# Patient Record
Sex: Female | Born: 1935 | Race: White | Hispanic: No | Marital: Married | Smoking: Never smoker
Health system: Southern US, Community
[De-identification: ages and names within clinical notes are randomized; demographics above are authoritative.]

## PROBLEM LIST (undated history)

## (undated) DIAGNOSIS — R58 Hemorrhage, not elsewhere classified: Principal | ICD-10-CM

## (undated) DIAGNOSIS — I10 Essential (primary) hypertension: Secondary | ICD-10-CM

## (undated) DIAGNOSIS — I498 Other specified cardiac arrhythmias: Secondary | ICD-10-CM

## (undated) DIAGNOSIS — I2510 Atherosclerotic heart disease of native coronary artery without angina pectoris: Secondary | ICD-10-CM

## (undated) DIAGNOSIS — E785 Hyperlipidemia, unspecified: Secondary | ICD-10-CM

## (undated) HISTORY — PX: EYE SURGERY: SHX253

## (undated) HISTORY — PX: INSERT / REPLACE / REMOVE PACEMAKER: SUR710

## (undated) HISTORY — DX: Other specified cardiac arrhythmias: I49.8

## (undated) HISTORY — DX: Hyperlipidemia, unspecified: E78.5

## (undated) HISTORY — DX: Atherosclerotic heart disease of native coronary artery without angina pectoris: I25.10

## (undated) HISTORY — PX: OTHER SURGICAL HISTORY: SHX169

## (undated) HISTORY — PX: APPENDECTOMY: SHX54

## (undated) HISTORY — PX: TONSILLECTOMY AND ADENOIDECTOMY: SHX28

## (undated) HISTORY — DX: Essential (primary) hypertension: I10

## (undated) HISTORY — PX: CARDIAC CATHETERIZATION: SHX172

## (undated) HISTORY — DX: Hemorrhage, not elsewhere classified: R58

---

## 1993-03-12 HISTORY — PX: BACK SURGERY: SHX140

## 2000-06-28 ENCOUNTER — Ambulatory Visit (HOSPITAL_COMMUNITY): Admission: RE | Admit: 2000-06-28 | Discharge: 2000-06-28 | Payer: Self-pay | Admitting: Obstetrics and Gynecology

## 2000-06-28 ENCOUNTER — Encounter: Payer: Self-pay | Admitting: Obstetrics and Gynecology

## 2000-10-25 ENCOUNTER — Ambulatory Visit (HOSPITAL_COMMUNITY): Admission: RE | Admit: 2000-10-25 | Discharge: 2000-10-25 | Payer: Self-pay | Admitting: Family Medicine

## 2000-10-25 ENCOUNTER — Encounter: Payer: Self-pay | Admitting: Family Medicine

## 2001-06-09 ENCOUNTER — Ambulatory Visit (HOSPITAL_COMMUNITY): Admission: RE | Admit: 2001-06-09 | Discharge: 2001-06-09 | Payer: Self-pay | Admitting: Neurosurgery

## 2001-06-09 ENCOUNTER — Encounter: Payer: Self-pay | Admitting: Neurosurgery

## 2002-01-22 ENCOUNTER — Ambulatory Visit (HOSPITAL_COMMUNITY): Admission: RE | Admit: 2002-01-22 | Discharge: 2002-01-22 | Payer: Self-pay | Admitting: Family Medicine

## 2002-01-22 ENCOUNTER — Encounter: Payer: Self-pay | Admitting: Family Medicine

## 2002-10-14 ENCOUNTER — Ambulatory Visit: Admission: RE | Admit: 2002-10-14 | Discharge: 2002-10-14 | Payer: Self-pay | Admitting: Orthopedic Surgery

## 2002-10-14 ENCOUNTER — Encounter: Payer: Self-pay | Admitting: Orthopedic Surgery

## 2003-08-12 ENCOUNTER — Emergency Department (HOSPITAL_COMMUNITY): Admission: EM | Admit: 2003-08-12 | Discharge: 2003-08-12 | Payer: Self-pay | Admitting: Emergency Medicine

## 2003-09-02 ENCOUNTER — Ambulatory Visit (HOSPITAL_COMMUNITY): Admission: RE | Admit: 2003-09-02 | Discharge: 2003-09-02 | Payer: Self-pay | Admitting: General Surgery

## 2004-01-19 ENCOUNTER — Ambulatory Visit: Payer: Self-pay | Admitting: Cardiology

## 2004-01-20 ENCOUNTER — Ambulatory Visit (HOSPITAL_COMMUNITY): Admission: RE | Admit: 2004-01-20 | Discharge: 2004-01-20 | Payer: Self-pay | Admitting: Family Medicine

## 2004-01-31 ENCOUNTER — Ambulatory Visit (HOSPITAL_COMMUNITY): Admission: RE | Admit: 2004-01-31 | Discharge: 2004-02-01 | Payer: Self-pay | Admitting: Family Medicine

## 2004-02-02 ENCOUNTER — Ambulatory Visit: Payer: Self-pay | Admitting: *Deleted

## 2004-02-07 ENCOUNTER — Ambulatory Visit: Payer: Self-pay | Admitting: *Deleted

## 2004-02-09 ENCOUNTER — Ambulatory Visit: Payer: Self-pay | Admitting: *Deleted

## 2004-02-09 ENCOUNTER — Ambulatory Visit (HOSPITAL_COMMUNITY): Admission: RE | Admit: 2004-02-09 | Discharge: 2004-02-09 | Payer: Self-pay | Admitting: *Deleted

## 2004-02-10 ENCOUNTER — Inpatient Hospital Stay (HOSPITAL_BASED_OUTPATIENT_CLINIC_OR_DEPARTMENT_OTHER): Admission: RE | Admit: 2004-02-10 | Discharge: 2004-02-10 | Payer: Self-pay | Admitting: *Deleted

## 2004-03-15 ENCOUNTER — Ambulatory Visit: Payer: Self-pay | Admitting: *Deleted

## 2004-05-08 ENCOUNTER — Ambulatory Visit (HOSPITAL_COMMUNITY): Admission: RE | Admit: 2004-05-08 | Discharge: 2004-05-08 | Payer: Self-pay | Admitting: Family Medicine

## 2004-07-13 ENCOUNTER — Emergency Department (HOSPITAL_COMMUNITY): Admission: EM | Admit: 2004-07-13 | Discharge: 2004-07-13 | Payer: Self-pay | Admitting: Emergency Medicine

## 2004-08-03 ENCOUNTER — Ambulatory Visit (HOSPITAL_COMMUNITY): Admission: RE | Admit: 2004-08-03 | Discharge: 2004-08-03 | Payer: Self-pay | Admitting: Family Medicine

## 2004-10-20 ENCOUNTER — Ambulatory Visit: Payer: Self-pay | Admitting: *Deleted

## 2004-11-03 ENCOUNTER — Ambulatory Visit: Payer: Self-pay | Admitting: *Deleted

## 2004-11-29 ENCOUNTER — Inpatient Hospital Stay (HOSPITAL_COMMUNITY): Admission: RE | Admit: 2004-11-29 | Discharge: 2004-12-01 | Payer: Self-pay | Admitting: Orthopedic Surgery

## 2006-05-07 ENCOUNTER — Ambulatory Visit: Payer: Self-pay | Admitting: Cardiovascular Disease

## 2006-05-22 ENCOUNTER — Ambulatory Visit: Payer: Self-pay | Admitting: Cardiovascular Disease

## 2006-05-22 ENCOUNTER — Encounter (HOSPITAL_COMMUNITY): Admission: RE | Admit: 2006-05-22 | Discharge: 2006-06-21 | Payer: Self-pay | Admitting: Cardiovascular Disease

## 2007-06-12 ENCOUNTER — Ambulatory Visit: Payer: Self-pay | Admitting: Cardiovascular Disease

## 2007-10-29 ENCOUNTER — Ambulatory Visit: Payer: Self-pay | Admitting: Internal Medicine

## 2007-10-29 ENCOUNTER — Inpatient Hospital Stay (HOSPITAL_COMMUNITY): Admission: EM | Admit: 2007-10-29 | Discharge: 2007-10-31 | Payer: Self-pay | Admitting: Emergency Medicine

## 2007-10-30 ENCOUNTER — Encounter: Payer: Self-pay | Admitting: Internal Medicine

## 2007-10-30 HISTORY — PX: PACEMAKER INSERTION: SHX728

## 2007-11-10 ENCOUNTER — Ambulatory Visit: Payer: Self-pay

## 2007-11-20 ENCOUNTER — Ambulatory Visit: Payer: Self-pay | Admitting: Cardiology

## 2008-02-18 ENCOUNTER — Ambulatory Visit: Payer: Self-pay | Admitting: Internal Medicine

## 2008-03-31 ENCOUNTER — Encounter (INDEPENDENT_AMBULATORY_CARE_PROVIDER_SITE_OTHER): Payer: Self-pay | Admitting: *Deleted

## 2008-04-09 ENCOUNTER — Encounter (INDEPENDENT_AMBULATORY_CARE_PROVIDER_SITE_OTHER): Payer: Self-pay | Admitting: *Deleted

## 2008-11-03 DIAGNOSIS — I10 Essential (primary) hypertension: Secondary | ICD-10-CM

## 2008-11-03 DIAGNOSIS — I2510 Atherosclerotic heart disease of native coronary artery without angina pectoris: Secondary | ICD-10-CM | POA: Insufficient documentation

## 2008-11-03 DIAGNOSIS — E119 Type 2 diabetes mellitus without complications: Secondary | ICD-10-CM | POA: Insufficient documentation

## 2008-12-01 ENCOUNTER — Ambulatory Visit: Payer: Self-pay | Admitting: Internal Medicine

## 2008-12-01 DIAGNOSIS — Z950 Presence of cardiac pacemaker: Secondary | ICD-10-CM | POA: Insufficient documentation

## 2009-06-15 ENCOUNTER — Ambulatory Visit: Payer: Self-pay | Admitting: Cardiology

## 2010-04-07 ENCOUNTER — Ambulatory Visit
Admission: RE | Admit: 2010-04-07 | Discharge: 2010-04-07 | Payer: Self-pay | Source: Home / Self Care | Attending: Internal Medicine | Admitting: Internal Medicine

## 2010-04-07 ENCOUNTER — Encounter: Payer: Self-pay | Admitting: Internal Medicine

## 2010-04-13 NOTE — Procedures (Signed)
Summary: PC2 6 MON   Current Medications (verified): 1)  Simvastatin 40 Mg Tabs (Simvastatin) .... Take 1 Tab Daily 2)  Diovan 160 Mg Tabs (Valsartan) .... Take 1 Tab Daily 3)  Metformin Hcl 1000 Mg Tabs (Metformin Hcl) .... Take 1 Tab Two Times A Day 4)  Glucotrol 5 Mg Tabs (Glipizide) .... One By Mouth in The Am  2 By Mouth Pm 5)  Metoprolol Succinate 25 Mg Xr24h-Tab (Metoprolol Succinate) .... Take 1 Tab Daily 6)  Oxybutynin Chloride 5 Mg Tabs (Oxybutynin Chloride) .... Take 1 Tab Daily 7)  Aspir-Low 81 Mg Tbec (Aspirin) .... Take 1tab Daily 8)  Fish Oil Concentrate 1200 Mg Caps (Omega-3 Fatty Acids) .... Take 1 Tab Daily 9)  Mag-Ox 400 400 Mg Tabs (Magnesium Oxide) .... One By Mouth Daily  Allergies (verified): No Known Drug Allergies   PPM Specifications Following MD:  Lewayne Bunting, MD     PPM Vendor:  St Jude     PPM Model Number:  [id number] [id number]     PPM Serial Number:  [id number] PPM DOI:  10/30/2007      Lead 1    Location: RA     DOI: 10/30/2007     Model #: [id number]     Serial #: [id number]     Status: active Lead 2    Location: RV     DOI: 10/30/2007     Model #: 1788TC     Serial #: [id number]     Status: active  Magnet Response Rate:  BOL 98.6 ERI 86.3  Indications:  Intermittent CHB   PPM Follow Up Remote Check?  No Battery Voltage:  2.78 V     Battery Est. Longevity:  9.75 years     Pacer Dependent:  No       PPM Device Measurements Atrium  Amplitude: 1.2 mV, Impedance: 350 ohms, Threshold: 0.5 V at 0.5 msec Right Ventricle  Amplitude: 12 mV, Impedance: 407 ohms, Threshold: 0.5 V at 0.5 msec  Episodes MS Episodes:  0     Percent Mode Switch:  0     Coumadin:  No Atrial Pacing:  28%     Ventricular Pacing:  <1%  Parameters Mode:  DDD     Lower Rate Limit:  60     Upper Rate Limit:  115 Paced AV Delay:  275     Sensed AV Delay:  250 Next Cardiology Appt Due:  12/10/2009 Tech Comments:  Ventricular auto capture on.   Device function normal.  ROV 6 months with Dr. Ladona Ridgel in  [facility]. Altha Harm, LPN  June 16, 863 11:07 AM  MD Comments:  Agree with above.

## 2010-04-13 NOTE — Assessment & Plan Note (Signed)
Summary: 6 mth f/u per checkout on 06/15/09/tg   Visit Type:  Follow-up Primary Provider:  Dr.Gerald Hill   History of Present Illness: [name] returns for PPM followup.  She is a pleasant elderly woman with a h/o HTN and symptomatic bradycardia who underwent PPM in 10/2007.  She denies c/p or sob.  Minimal peripheral edema. She remains quite active going dancing 4-5 times a week.  Current Medications (verified): 1)  Simvastatin 40 Mg Tabs (Simvastatin) .... Take 1 Tab Daily 2)  Diovan Hct 160-12.5 Mg Tabs (Valsartan-Hydrochlorothiazide) .... Take 1 Tab Daily 3)  Metformin Hcl 1000 Mg Tabs (Metformin Hcl) .... Take 1 Tab Two Times A Day 4)  Glucotrol 5 Mg Tabs (Glipizide) .... One By Mouth in The Am  2 By Mouth Pm 5)  Metoprolol Succinate 25 Mg Xr24h-Tab (Metoprolol Succinate) .... Take 1 Tab Daily 6)  Oxybutynin Chloride 5 Mg Tabs (Oxybutynin Chloride) .... Take 1 Tab Two Times A Day 7)  Aspir-Low 81 Mg Tbec (Aspirin) .... Take 1tab Daily 8)  Fish Oil Concentrate 1200 Mg Caps (Omega-3 Fatty Acids) .... Take 1 Tab Daily 9)  Potassium 99 Mg Tabs (Potassium) .... Take 1 Tab Daily 10)  Melatonin 5 Mg Tabs (Melatonin) .... Take 1 Tab Dialy 11)  Aspir-Low 81 Mg Tbec (Aspirin) .... Take 1 Tab Daily  Allergies (verified): No Known Drug Allergies  Comments:  Nurse/Medical Assistant: patient brought med list [place] [facility] and [facility] the only change is diovan 160 went to diovan 160/12.5 mg daily  Past History:  Past Medical History: Last updated: 11/03/2008 Current Problems:  DM (ICD-250.00) HYPERTENSION (ICD-401.9) CAD (ICD-414.00)  Past Surgical History: Last updated: 11/03/2008 cath pacemaker inserted st.jude zephyr dual lead pacemaker august 20,2009  Review of Systems  The patient denies chest pain, syncope, dyspnea on exertion, and peripheral edema.    Vital Signs:  Patient profile:   77 year old female Weight:      122 pounds BMI:     20.38 Pulse rate:    69 / minute BP sitting:   133 / 62  (left arm)  Vitals Entered By: Dreama Saa, CNA (April 07, 2010 8:56 AM)  Physical Exam  General:  Well developed, well nourished, in no acute distress. Head:  normocephalic and atraumatic Eyes:  PERRLA/EOM intact; conjunctiva and lids normal. Mouth:  Teeth, gums and palate normal. Oral mucosa normal. Neck:  Neck supple, no JVD. No masses, thyromegaly or abnormal cervical nodes. Chest Wall:  Well healed PPM incision. Lungs:  Clear bilaterally with no wheezes, rales, or rhonchi. Heart:  RRR with normal S1 and S2.  PMI is not enalarged or laterally displaced. Abdomen:  Bowel sounds positive; abdomen soft and non-tender without masses, organomegaly, or hernias noted. No hepatosplenomegaly. Msk:  Back normal, normal gait. Muscle strength and tone normal. Pulses:  pulses normal in all 4 extremities Extremities:  No clubbing or cyanosis. No edema. Neurologic:  Alert and oriented x 3.   PPM Specifications Following MD:  Lewayne Bunting, MD     PPM Vendor:  St Jude     PPM Model Number:  [id number]     PPM Serial Number:  [id number] PPM DOI:  10/30/2007      Lead 1    Location: RA     DOI: 10/30/2007     Model #: 1699TC     Serial #: [id number]     Status: active Lead 2    Location: RV     DOI: 10/30/2007  Model #: [id number]     Serial #: [id number]     Status: active  Magnet Response Rate:  BOL 98.6 ERI 86.3  Indications:  Intermittent CHB   PPM Follow Up Remote Check?  No Battery Voltage:  2.78 V     Battery Est. Longevity:  9.25 years     Pacer Dependent:  No       PPM Device Measurements Atrium  Amplitude: 0.8 mV, Impedance: 313 ohms, Threshold: 0.5 V at 0.5 msec Right Ventricle  Amplitude: 12 mV, Impedance: 352 ohms, Threshold: 0.5 V at 0.5 msec  Episodes MS Episodes:  1     Percent Mode Switch:  <1%     Coumadin:  No Atrial Pacing:  31%     Ventricular Pacing:  1%  Parameters Mode:  DDD     Lower Rate Limit:  60     Upper Rate Limit:  115 Paced  AV Delay:  275     Sensed AV Delay:  250 Tech Comments:  No parameter changes.  1 mode switch lasting 2:26 minutes, - coumadin.  ROV 6 months [facility] Altha Harm, LPN  April 07, 2010 9:12 AM  MD Comments:  Agree with above. Her mode switch rate was 180/min.  Impression & Recommendations:  Problem # 1:  CARDIAC PACEMAKER IN SITU (ICD-V45.01) Her device is working normally. Will followup in several monthns.  Problem # 2:  HYPERTENSION (ICD-401.9) Her blood pressure appears to be well controlled. will ask her to maintain a low sodium diet. Her updated medication list for this problem includes:    Diovan Hct 160-12.5 Mg Tabs (Valsartan-hydrochlorothiazide) .Marland Kitchen... Take 1 tab daily    Metoprolol Succinate 25 Mg Xr24h-tab (Metoprolol succinate) .Marland Kitchen... Take 1 tab daily    Aspir-low 81 Mg Tbec (Aspirin) .Marland Kitchen... Take 1tab daily    Aspir-low 81 Mg Tbec (Aspirin) .Marland Kitchen... Take 1 tab daily  Patient Instructions: 1)  Your physician recommends that you schedule a follow-up appointment in: 6months for device check and in 1 year with Dr. Ladona Ridgel 2)  Your physician recommends that you continue on your current medications as directed. Please refer to the Current Medication list given to you today.

## 2010-07-25 NOTE — Op Note (Signed)
[name], Bridget NO.:  [id number]   MEDICAL RECORD NO.:  [id number]          PATIENT TYPE:  INP   LOCATION:  2903                         FACILITY:  [facility]   PHYSICIAN:  Doylene Canning. Ladona Ridgel, MD    DATE OF BIRTH:  02-09-1926   DATE OF PROCEDURE:  10/30/2007  DATE OF DISCHARGE:                               OPERATIVE REPORT   PROCEDURE PERFORMED:  Implantation of a dual-chamber pacemaker.   INDICATIONS:  Symptomatic intermittent complete heart block.   OPERATIVE INDICATIONS:  The patient is an 77 year old lady with a  history of hypertension and left bundle-branch block who is admitted to  the hospital after experiencing dizziness and lightheadedness and  shortness of breath.  She was subsequently found to be in 2:1 heart  block as well periods of complete heart block and she is now referred  for insertion of a dual-chamber pacemaker.   PROCEDURE:  After informed consent was obtained, the patient was taken  to the diagnostic EP lab in fasting state.  After usual preparation and  draping, intravenous fentanyl and midazolam was given for sedation.  Lidocaine 30 mL was infiltrated into the left infraclavicular region.  A  5-cm incision was carried out over this region and electrocautery was  utilized to dissect down to the fascial plane.  The left subclavian vein  was punctured x2 and the St. Jude model 1788T, 52-cm active-fixation  pacing lead, serial #[id number] was advanced to the right ventricle.  The  St. Jude model [phone], 46-cm active-fixation pacing lead, serial  [id number] was advanced through right atrium.  Mapping was subsequently  carried out in the right ventricle and at the final site.  The R-waves  measured 16 mV, pacing threshold 0.6 volts at 0.5 milliseconds, and  pacing impedance was 656 ohms with lead actively fixed.  A 10-volt  pacing not stimulate the diaphragm.  With the ventricular lead in  satisfactory position, attention was then turned to  placement of the  atrial lead, which was placed in the anterolateral portion of the right  atrium where P-waves were 3, the pacing threshold with lead actively  fixed 0.5 volts at 0.5 milliseconds, and 10-volt pacing was 598 ohms.  With both the atrial and ventricular leads in satisfactory position, it  was secured to subpectoralis fascia with a figure-of-eight silk suture.  The sewing sleeve was also secured with silk suture.  Electrocautery was  to make a subcutaneous pocket. Kanamycin irrigation was utilized to  irrigate the pocket and electrocautery was utilized to assure  hemostasis.  A St. Jude Zephyr XL DR dual-chamber pacemaker, serial  [id number] was advanced to the atrium, otherwise was connected to the  atrial and ventricular leads and placed back in the subcutaneous pocket  where the generator secured with silk suture.  Additional Kanamycin was  utilized to irrigate the pocket and the incision was closed with 2-0  Vicryl followed by layer of 3-0 Vicryl.  Benzoin was painted on the  skin.  Steri-Strips were applied.  A pressure dressing was placed, and  the patient was returned to her room  in satisfactory condition.   COMPLICATIONS:  There were no immediate complications.   RESULTS:  This demonstrates successful implantation of a St. Jude dual-  chamber pacemaker to the patient with symptomatic intermittent complete  heart block.  There were no immediate complications.      Doylene Canning. Ladona Ridgel, MD  Electronically Signed     GWT/MEDQ  D:  10/30/2007  T:  10/31/2007  Job:  [id number]   cc:   Annia Friendly. Loleta Chance, MD

## 2010-07-25 NOTE — Assessment & Plan Note (Signed)
[facility]                       [place] CARDIOLOGY OFFICE NOTE   Bridget, Rowland                        MRN:          [id number]  DATE:11/20/2007                            DOB:          Aug 07, 1925    CARDIOLOGIST:  Previously Dr. Charlton Haws.  I will establish her with  Dr. Diona Browner in our [place] office for future followup.  Electrophysiologist, Dr. Lewayne Bunting.   PRIMARY CARE PHYSICIAN:  Annia Friendly. Hill, MD   REASON FOR VISIT:  Post-hospitalization followup.   HISTORY OF PRESENT ILLNESS:  [name] is an 77 year old female patient  with a history of normal coronaries by catheterization in 2005,  preserved LV function, and a previous history of PVCs and nonsustained  ventricular tachycardia as demonstrated on prior event monitoring.  Her  workup in the past included a Myoview study in March 2008 that revealed  no ischemia or infarction and EF of 80%.  No further workup was felt to  be warranted for her nonsustained ventricular tachycardia in the setting  of normal LV function and no ischemic heart disease.  She recently  presented to [facility] with near-syncope.  She was noted to  have symptomatic intermittent complete heart block.  She underwent  permanent pacemaker implantation with Dr. Lewayne Bunting on August 20.  An  echocardiogram during that admission revealed an EF of 55%, mild mitral  regurgitation, and mild to moderately increased estimated peak pulmonary  systolic pressures.  Her hospitalization was fairly uneventful and she  returns for followup today.  She notes some warmth over her pacemaker  site.  She has already seen the nurse in the [facility] in  followup since discharge.  Apparently, her pacemaker is functioning well  and she is only pacing about 1% of the time.  She denies any fevers,  drainage, or redness.  She denies chest pain.  She had 1 episode of  shortness of breath when she arose too quickly  the other day.  Other  than that, she denies exertional shortness of breath.  She denies any  syncope.   CURRENT MEDICATIONS:  Metoprolol 25 mg daily, Diovan 160 mg daily,  Metformin 1 g b.i.d., Glipizide 5 mg b.i.d., Oxybutynin 5 mg daily,  Aspirin 81 mg daily, Simvastatin 40 mg daily,  Amitriptyline 25 mg daily.   PHYSICAL EXAMINATION:  GENERAL:  She is a well-nourished and well-  developed female in no distress.  VITAL SIGNS:  Blood pressure is 110/60, pulse 71, and weight 133 pounds.  HEENT:  Normal.  NECK:  Without JVD.  CARDIAC:  S1 and S2.  Regular rate and rhythm.  LUNGS:  Clear to auscultation bilaterally.  ABDOMEN:  Normoactive bowel sounds.  EXTREMITIES:  Without edema.  NEUROLOGIC:  She is alert and oriented x3.  Cranial nerves II through  XII are grossly intact.  VASCULAR:  Without carotid bruits bilaterally.   DATABASE:  Labs from her recent hospitalization revealed a total  cholesterol of 75, triglycerides 161, HDL 28, and LDL 15.  Her AST was  32 and ALT 37.   Electrocardiogram reveals sinus rhythm  at heart rate of 71 and left  bundle-branch block (chronic).   ASSESSMENT AND PLAN:  1. Symptomatic intermittent complete heart block, status post      permanent pacemaker implantation.  She is overall stable.  Her      wound looks stable.  There is some warmth, but there is no redness.      Her swelling is minimal.  There is no discharge.  She is afebrile      today with a temperature of 98.4 degrees Fahrenheit.  I have      apprised her of the signs and symptoms to look for that would      indicate infection.  She knows to call us back if there are any      problems.  She will continue follow up with Dr. Ladona Ridgel as      indicated.  2. Hyperlipidemia.  As noted above, her recent lab work revealed a      significantly depressed LDL level.  I have recommended that we      decrease her simvastatin to 20 mg daily.  She will have followup      blood work with her  primary care physician in a couple of months      and we will obtain those results.  3. Hypertension.  This is well controlled.  She will continue on her      current medications.  4. Diabetes mellitus.  She will continue followup with her primary      care physician for this.  .   DISPOSITION:  The patient will be brought back in routine followup with  Dr. Diona Browner in 1 year's time.  She knows to return sooner as needed.  She will obtain her pacemaker followup with Dr. Ladona Ridgel as indicated.      Tereso Newcomer, PA-C  Electronically Signed      Jonelle Sidle, MD  Electronically Signed   SW/MedQ  DD: 11/20/2007  DT: 11/21/2007  Job #: [id number]   cc:   Annia Friendly. Loleta Chance, MD

## 2010-07-25 NOTE — Assessment & Plan Note (Signed)
[facility]                       [facility] CARDIOLOGY OFFICE NOTE   TAMMEE, THIELKE                        MRN:          [id number]  DATE:06/12/2007                            DOB:          1925/08/25    Ms. Sesma is a delightful 77 year old patient who had previous  presyncope, PVCs and hypertension.  She has had nonobstructive coronary  disease by cath in 2005.  She had good LV function in 2005, by echo.   The patient was recently evaluated for a presyncopal episode   In March 2008, she had normal LV function by echo.  Holter monitoring  showed only occasional PVCs was no arrhythmias and a nonischemic Myoview  with an EF of 80%.   The patient has been feeling well.  She has not had any recurrent  episodes.  She denied any significant chest pain, palpitation, PND or  orthopnea.  She saw Dr. Loleta Chance recently for a physical and is doing well.  She has been compliant with her meds.   REVIEW OF SYSTEMS:  Remarkable for good blood sugar control.  Her  hemoglobin A1c was under 7, and she checks her sugar twice a day.  She  is otherwise active.   CURRENT MEDICATIONS:  1. Metoprolol 25 a day.  2. Diovan 160 a day.  3. Metformin 1 gm b.i.d.  4. Glipizide five b.i.d.  5. Aspirin daily.  6. Simvastatin 40 a day.  7. Amitriptyline 25 a day.   PHYSICAL EXAMINATION:  GENERAL:  Remarkable for a healthy-appearing  white female who looks younger than her stated age.  VITAL SIGNS:  Weight is 134, blood pressure 140/70, pulse 74,  respiratory rate 14, afebrile.  HEENT:  Unremarkable.  NECK:  Carotids normal without bruit.  No lymphadenopathy, thyromegaly  or JVP elevation.  LUNGS:  Clear.  Good diaphragmatic motion.  No wheezing.  CARDIOVASCULAR:  S1-S2, normal heart sounds.  PMI normal.  ABDOMEN:  Benign.  Bowel sounds positive.  No AAA, no tenderness.  No  hepatosplenomegalyhepatojugular reflux.  Distal pulses intact.  No edema.  NEURO:   Nonfocal.  SKIN:  Warm and dry.  No muscular weakness.   IMPRESSION:  1. Presyncope, not recurrent, benign workup, continue to monitor.  2. History of premature ventricular contractions and short bursts of      nonsustained ventricular tachycardia.  Continue beta-blocker.  Not      significant in the setting of normal Myoview and normal ol      ventricular function.  3. Hypertension, currently well-controlled.  Continue low-salt diet,      Diovan and beta-blocker.  Overall, I think Nikira is doing well.  She      has a good chance to reach 89 given her current health, longevity      runs in her family.  Will see her back in a year.     Noralyn Pick. Eden Emms, MD, [facility]  Electronically Signed    PCN/MedQ  DD: 06/12/2007  DT: 06/12/2007  Job #: [id number]   cc:   Annia Friendly. Loleta Chance, MD

## 2010-07-25 NOTE — Discharge Summary (Signed)
Bridget Rowland, Bridget Rowland NO.:  [id number]   MEDICAL RECORD NO.:  [id number]          PATIENT TYPE:  INP   LOCATION:  2903                         FACILITY:  [facility]   PHYSICIAN:  Noralyn Pick. Eden Emms, MD, FACCDATE OF BIRTH:  February 20, 1926   DATE OF ADMISSION:  10/29/2007  DATE OF DISCHARGE:                               DISCHARGE SUMMARY   PROCEDURES:  1. A 2D echocardiogram.  2. Implantation of a dual-chamber pacemaker.   PRIMARY AND FINAL DISCHARGE DIAGNOSIS:  Symptomatic intermittent  complete heart block.   SECONDARY DIAGNOSES:  1. History of nonsustained ventricular tachycardia by Holter.  2. Status post cardiac catheterization in December 2005 showing normal      coronary arteries.  3. Status post Myoview in March 2008 showing no scar or ischemia and      an EF of 80%.  4. Status post echocardiogram in this admission showing an EF of 55%      with no wall motion abnormalities and a PAS of 36.  5. Hypertension.  6. Hyperlipidemia.  7. Diabetes.  8. History of left foot reconstruction.  9. History of cough secondary to Altace.  10.History of coronary artery disease in her father.   TIME OF DISCHARGE:  34 minutes.   HOSPITAL COURSE:  [name] is an 77 year old female with no previous  history of coronary artery disease.  She has been followed by Cardiology  for presyncope.  She was noting that her heart rate was in the 30s some  times at home and was weak with this.  She was admitted for further  evaluation.   Cardiac enzymes were cycled and were negative.  An echocardiogram was  within normal limits.  She was evaluated by Dr. Ladona Ridgel with ED and it  was felt that permanent pacemaker was indicated.  A St. Jude Zephyr dual  lead pacemaker was inserted on October 30, 2007.  This is in DDD mode.  She tolerated the procedure well.   On October 31, 2007, [name] had a chest x-ray, which showed no  pneumothorax.  There was a possible enlargement of the left lobe  of the  thyroid gland.  This is unchanged from 2009.  A TSH was checked this  admission and was within normal limits.  Per Radiology recommendations,  if her TSH becomes abnormal or if there is any enlargement on chest x-  ray, further evaluation including an ultrasound would be warranted at  that time.  Of note, a lipid profile showed a total cholesterol of 75,  triglycerides 161, HDL 28, and LDL 15.  A urine culture showed no growth  and a CBC showed some mild edema with a hemoglobin of 11, hematocrit  32.8, WBC 6.9, and platelets 203.  On October 31, 2007, [name] was  evaluated by the pacemaker rep and seen by Dr. Eden Emms.  He felt that she  could be safely discharged home with close outpatient followup.   DISCHARGE INSTRUCTIONS:  1. Her activity level is to be increased gradually per discharge sheet      instructions.  2. She  is to call our office for any problems with the incision.  3. She is to get her wound and pacer checked in [place] on November 10, 2007, at 3:30 and follow up with the PA for Dr. Eden Emms on      November 20, 2007, at 10:15.  4. She is to follow up with Dr. Loleta Chance as needed.   DISCHARGE MEDICATIONS:  1. Simvastatin 40 mg a day.  2. Diovan 160 mg a day.  3. Metformin 1000 mg b.i.d.  4. Glipizide 5 mg b.i.d.  5. Metoprolol 25 mg daily.  6. Oxybutynin 5 mg b.i.d.  7. Amitriptyline 25 mg at bedtime.  8. Aspirin 81 mg a day.  9. Calcium.  10.Omega 3 daily.      Theodore Demark, PA-C      Noralyn Pick. Eden Emms, MD, [facility]  Electronically Signed    RB/MEDQ  D:  10/31/2007  T:  11/01/2007  Job:  [id number]   cc:   Annia Friendly. Loleta Chance, MD

## 2010-07-25 NOTE — H&P (Signed)
Bridget Rowland, Bridget Rowland NO.:  [id number]   MEDICAL RECORD NO.:  [id number]          PATIENT TYPE:  INP   LOCATION:  2903                         FACILITY:  [facility]   PHYSICIAN:  Duke Salvia, MD, FACCDATE OF BIRTH:  Jul 15, 1925   DATE OF ADMISSION:  10/29/2007  DATE OF DISCHARGE:                              HISTORY & PHYSICAL   PRIMARY CARDIOLOGIST:  Dr. Charlton Haws in [place].   PRIMARY CARE Alauna Hayden:  Annia Friendly. Hill, MD   PATIENT PROFILE:  An 77 year old Caucasian female with history of normal  coronary arteries who presents with now a 3-day history of symptomatic  bradycardia and now documented second-degree type 2 heart block.   PROBLEMS:  1. Second-degree type 2 heart block/symptomatic bradycardia.  2. History of nonsustained ventricular tachycardia.  This was      documented on Holter monitoring.      a.     Cardiac catheterization on February 10, 2004, normal coronary       artery, ejection fraction 60-65%.      b.     Negative Myoview in March 2008, ejection fraction 80%.      c.     On May 22, 2006, 2-D echocardiogram, ejection fraction       60%, mild atrial stenosis, trivial mitral regurgitation, mild left       atrial enlargement.  3. Hypertension.  4. Hyperlipidemia.  5. Type 2 diabetes mellitus x10 years.  6. History of presyncope/syncope.  7. History of tight heel cords status post left foot reconstruction in      September 2006.   HISTORY OF PRESENT ILLNESS:  An 77 year old Caucasian female with  history of nonsustained ventricular tachycardia status post normal  catheterizations in December 2005 and negative Myoview and normal echo  in March 2008.  She was previously active, working out at [employer] 3  times a week up until this past Monday, October 27, 2007, when she awoke  with marked dyspnea and diaphoresis followed by profound weakness and  lightheadedness.  Symptoms worsened with activity and she ended up  taking some meclizine  and rested for the remainder of the day with  intermittent symptoms.  She did check her blood pressure and her heart  rate throughout the day and noted blood pressures in the 110s-130s and  rates in the 40s and sometimes 30s.  When she awoke yesterday on October 28, 2007, she felt a little bit better and had more energy and reduced  dyspnea throughout the day.  Last evening unfortunately, she noted  recurrent dyspnea as well as substernal chest pressure with associated  diaphoresis occurring on and off throughout the night.  When she awoke  this morning, she had recurrent chest discomfort and dyspnea, and saw  her primary care Brydon Spahr who activated EMS and had her come to the  [facility] ED.  Here, ECG shows sinus bradycardia with rate in the 30s  and second-degree type 2 heart block, 2:1 conduction.  She also has  inferolateral ST depression and anterolateral T-wave inversion.  She  denies chest pain  or shortness of breath currently.  Her blood pressure  is stable.  Heart rate is in the 30s despite IV glucagon given by ER  staff.   ALLERGIES:  ALTACE may have caused a cough.   HOME MEDICATIONS:  1. Simvastatin 40 mg nightly.  2. Diovan 160 mg daily.  3. Metformin 1000 mg daily.  4. Glipizide 5 mg daily.  5. Toprol-XL 25 mg daily.  6. Oxybutynin 5 mg b.i.d.  7. Amitriptyline 25 mg nightly.  8. Aspirin 81 mg daily.  9. Calcium daily.  10.Omega-3, 6 and 9 daily.   FAMILY HISTORY:  Mother died at age 44, she did have a history of brain  aneurysm, 2 hip and wrist fractures.  Father died at 13 of complications  of coronary artery disease.  Also had Bright's disease.  She had a  sister who died with stomach problems, and another sister who is alive  and well.   SOCIAL HISTORY:  She lives in [place] by herself.  She is retired  from a Dentist.  She denies any tobacco or drug use.  She will  rarely have alcoholic beverage.  She works out at [employer] 3 times a  week.    REVIEW OF SYSTEMS:  Positive for chest pain, dyspnea, both at rest and  on exertion, presyncope/lightheadedness, diaphoresis, weakness, and  diabetes for the past 10 years.  Otherwise, all systems reviewed and  negative.   PHYSICAL EXAMINATION:  VITAL SIGNS:  Temperature 98.1, heart rate 37,  respirations 21, blood pressure 134/62, and pulse ox 97% on 2 L.  GENERAL:  Pleasant white female in no acute distress.  Awake, alert, and  oriented x3.  HEENT:  Normal.  Nares grossly intact and nonfocal.  SKIN:  Warm and dry without lesions or masses.  NECK:  No bruits or JVD.  LUNGS:  Respirations regular, unlabored.  CARDIAC:  Irregular, bradycardic, S1 and S2.  No S3, S4, or murmurs.  Heart sounds are distant.  ABDOMEN:  Round, soft, nontender, and nondistended.  Bowel sounds  present.  EXTREMITIES:  Warm, dry, and pink.  No clubbing, cyanosis, or edema.  Dorsalis pedis and posterior tibial pulses 1+ and equal bilaterally.  No  femoral bruits noted.   Chest x-ray shows no acute findings.  EKG shows sinus bradycardia,  second-degree type 2 heart block with 2:1 conduction, rate is 34.  She  has a right bundle branch block.  Notably, she previously had a left  bundle branch block.  She has ST depression, T-wave inversions in II,  III, aVF, V1 through V5.   LABORATORY WORK:  Sodium 135, potassium 4.9, chloride 105, CO2 23, BUN  26, creatinine 0.9, glucose 137, CK-MB 1.1, troponin I less than 0.5,  PTT 28, INR 1.0.   ASSESSMENT AND PLAN:  1. Symptomatic bradycardia/second-degree type 2 heart block.  Appears      to have been present since Monday where she had documented      bradycardic rates in the 30s and 40s at home.  She has had dyspnea,      weakness, and chest pain as well as lightheadedness throughout the      night and this morning.  Despite her significant symptoms, cardiac      markers were normal so far.  Plan to admit to CCU and cycle cardiac      markers.  We will hold her  beta-blocker.  We will add heparin.  If      enzymes  remain negative and echocardiogram is normal, we will plan      permanent pacemaker placement in the a.m.  We will keep these all      at the bedside.  Notably electrolytes are okay.  2. Chest pain/dyspnea.  See #1.  Cycle cardiac markers.  She had      normal cath 3-1/2 years ago and negative Myoview in 2009 making new      obstructive coronary artery disease unlikely. If troponins and      enzymes are negative, we will not pursue catheterization.  We will      hold on heparin for now.  Continue aspirin and statin.  3. Hypertension, stable.  Continue ARB.  4. Diabetes mellitus.  Continue glipizide.  Hold metformin.  Add      sliding scale insulin.  5. Hyperlipidemia.  Check lipids and LFTs, continue statin.      Nicolasa Ducking, ANP      Duke Salvia, MD, [facility]  Electronically Signed    CB/MEDQ  D:  10/29/2007  T:  10/30/2007  Job:  [id number]

## 2010-07-25 NOTE — Assessment & Plan Note (Signed)
[facility]                         ELECTROPHYSIOLOGY OFFICE NOTE   NAME:BAYNESVung, Kush                        MRN:          [id number]  DATE:02/18/2008                            DOB:          1926-01-24    Ms. Labombard returns today for followup.  She is a very pleasant 77-year-  old woman with a history of symptomatic bradycardia status post  pacemaker insertion, who returns today for followup.  She denies chest  pain or shortness of breath.  She has continued to exercise regularly  and no specific complaints today.   MEDICINES:  1. Metoprolol 25 daily.  2. Diovan 160 a day.  3. Metformin 1 g twice daily.  4. Glipizide 5 twice daily.  5. Oxybutynin 5 a day.  6. Aspirin 81 a day.  7. Simvastatin 40 a day.  8. Multiple vitamins.   PHYSICAL EXAMINATION:  GENERAL:  She is a pleasant well-appearing  elderly woman in no distress.  VITAL SIGNS:  Blood pressure was 139/79, the pulse 81 and regular,  respirations were 18, and the weight was 131 pounds.  NECK:  No jugular venous distention.  LUNGS:  Clear bilaterally to auscultation.  No wheezes, rales, or  rhonchi are present.  CARDIOVASCULAR:  Regular rate and rhythm.  Normal S1 and S2.  ABDOMEN:  Soft and nontender.  EXTREMITIES:  No edema.   Interrogation of the pacemaker demonstrates a Engineer, structural.  P waves  were 0.8.  The R waves were greater than 12.  The impedance 352 in the A  and 534 in the V.  The threshold is 0.5 at 0.5 both in the right atrium  and in the right ventricle.  The battery voltage was 2.78 volts.  She  was A pacing approximately 6% of the time.  Today, we turned her outputs  down to 2 at 0.5 in the A and 2.5 at 0.4 in the RV.   IMPRESSION:  1. Symptomatic bradycardia.  2. Hypertension.  3. Status post pacemaker insertion.   DISCUSSION:  Overall, Ms. Corriher is stable.  Her pacemaker is working  normally.  We will see her back in the office in 9 months or so for  pacemaker followup.     Doylene Canning. Ladona Ridgel, MD  Electronically Signed    GWT/MedQ  DD: 02/18/2008  DT: 02/18/2008  Job #: [id number]   cc:   Annia Friendly. Loleta Chance, MD

## 2010-07-28 NOTE — Procedures (Signed)
Bridget Rowland, Bridget Rowland                 ACCOUNT NO.:  [id number]   MEDICAL RECORD NO.:  [id number]          PATIENT TYPE:  OUT   LOCATION:  RAD                           FACILITY:  [facility]   PHYSICIAN:  [place] Bing, M.D.  DATE OF BIRTH:  09-18-1925   DATE OF PROCEDURE:  01/20/2004  DATE OF DISCHARGE:                                  ECHOCARDIOGRAM   REFERRING PHYSICIAN:  Dr. Loleta Chance.   CLINICAL DATA:  A 77 year old woman with transient ischemic attack.   M-MODE:  Aorta 2.6, left atrium 3.2, septum 1.5, posterior wall 1.3, LV  diastole 3.7, LV systole 2.7.   RESULTS:  1.  Technically difficult and somewhat limited echocardiographic study.  2.  Normal left and right atrial size.  Normal right ventricular size and      function;  mild right ventricular hypertrophy.  3.  Mild aortic valvular sclerosis and calcification of the proximal      ascending aorta.  4.  Normal mitral valve;  mild annular calcification.  5.  Normal tricuspid valve;  trivial regurgitation.  6.  Normal internal dimension of the left ventricle;  borderline left      ventricular hypertrophy.  Normal left ventricular systolic function.  7.  Normal inferior vena cava.     Robe   RR/MEDQ  D:  01/20/2004  T:  01/20/2004  Job:  [id number]

## 2010-07-28 NOTE — Consult Note (Signed)
NAMEYUVAL, NOLET NO.:  [id number]   MEDICAL RECORD NO.:  [id number]          PATIENT TYPE:  INP   LOCATION:  1518                         [facility]ACILITY:  [facility]   PHYSICIAN:  Pricilla Riffle, M.D.    DATE OF BIRTH:  1925-06-09   DATE OF CONSULTATION:  DATE OF DISCHARGE:  12/01/2004                                   CONSULTATION   REQUESTING PHYSICIAN:  Leonides Grills, M.D.   PRIMARY CARDIOLOGIST:  Vida Roller, M.D.   PRIMARY CARE PHYSICIAN:  Annia Friendly. Loleta Chance, MD   HISTORY:  Ms. Ellerman is a 77 year old white female who was admitted  yesterday for left foot surgery.  We are asked to consult secondary to  postoperative hypotension.  Patient describes a long history, years, of  left foot and ankle pain, despite shots.  She was diagnosed with bilateral  tight heel cords and did not have any improvement with custom shoes, thus  surgery was scheduled.  She underwent left foot reconstruction with left  iliac hip graft today without difficulty.  Anesthesia report shows I&O with  900 in, negligible output.  Blood pressure during surgery remains in the 90s-  150s over 40s-70s.  Heart rate 40s to 60s.  Since her surgery this morning,  nursing has reported that her blood pressure has been low; however, on  review of the reports, systolically, it has been up in the low 90s and 100s.  The patient has been asymptomatic.  She has not had any problems with  weakness or dizziness and states that she has been out of bed.  She denies  any cardiac issues.  She states that she does have some discomfort in the  left foot, and she has noticed that she has had some facial flushing.   ALLERGIES:  ACE INHIBITOR's with cough and difficulty swallowing.   MEDICATIONS PRIOR TO ADMISSION:  1.  Potassium, unknown dose, q.h.s.  2.  Senokot-S p.r.n.  3.  Glucovance 2.5/250 b.i.d.  4.  Atacand 4 mg daily.  5.  Ditropan 5 b.i.d.  6.  Lopressor 50 b.i.d.  7.  Amitriptyline 25 q.h.s.  8.  Lipitor  40 q.h.s.  9.  Actos 15 daily.  10. Calcium, magnesium, and zinc daily.  11. Aspirin 325 daily.   Her medical history is notable for a weak spell in November, 2005.  She  was diagnosed with a new left bundle branch block on her EKG.  Echocardiogram at that time showed a normal EF with mild LV and RVH.  Head  CT was negative.  Carotid Dopplers were negative.  Holter monitor did show  three short episodes of nonsustained ventricular tachycardia.  Catheterization at that time did not show any coronary artery disease.  EF  was 60-65%.  She does have a history of non-insulin-dependent diabetes.  She  states that her sugars at home are less than 150.  History of hypertension.  Usually at home, it ranges from the 130s-140s/70.  History of  hyperlipidemia, unknown last check.   Surgeries are notable for T&A, appendectomy, back surgery, bilateral  cataract surgery.  SOCIAL HISTORY:  She resides in [place] alone.  Her husband resides in a  nursing home.  She has a daughter and son.  Her daughter plans to stay with  her for the next month.  She is a retired Theatre manager for [employer].  She denies any alcohol, tobacco, or drug usage.   FAMILY HISTORY:  Notable for the death of her mother after two hip fractures  and two wrist fractures.  She had a history of a brain aneurysm.  Her father  died at the age of 71 with heart problems and Bright's disease.  One sister  is deceased from stomach problems.  Another sister is alive and well.   REVIEW OF SYSTEMS:  Notable for reading glasses, back arthralgias,  occasional stress incontinence, and problems with her left foot, as  previously described.   PHYSICAL EXAMINATION:  VITAL SIGNS:  Temperature 98.1, blood pressure  100/58, pulse 76, respirations 16, 98% on sat on room air.  Admission weight  was 138.  GENERAL:  A well-developed, well-nourished, pleasant white female who  appears younger than her stated age.  HEENT:   Essentially unremarkable, although she does have some irregularities  in her right pupil, probably secondary to her prior surgeries.  NECK:  Supple without thyromegaly, adenopathy, JVD, or carotid bruits.  LUNGS:  Symmetrical excursion.  Lungs are clear to auscultation.  HEART:  PMI is not displaced.  Regular rate and rhythm with a normal S1 and  S2.  I do not appreciate any murmurs, rubs, clicks, or gallops.  SKIN:  Intact.  ABDOMEN:  Soft.  Bowel sounds present without organomegaly, masses, or  tenderness.  EXTREMITIES:  Her left foot up to the knee is in a cast, otherwise  unremarkable.  NEUROLOGIC:  Unremarkable.   Chest x-ray done earlier in 2006 did not show any acute abnormalities.   EKG preoperatively on September 15th shows normal sinus rhythm.  Left axis  deviation.  Left bundle branch block.  Old EKGs are not available for  comparison.   H&H 13.4 and 38.4.  Normal indices.  Platelets 296, WBCs 8.8.  Sodium 135,  potassium 5, BUN 16, creatinine 0.8, glucose 173.   Currently, metoprolol and Avapro have been held.   IMPRESSION:  1.  Status post left foot reconstruction, etc., performed today.      Postoperative normocytic anemia.  Today her hemoglobin and hematocrit      was 10.5 and 29.7.  2.  Postoperative hypotension; however, asymptomatic.  3.  Hyperglycemia with a history of diabetes.  Her hemoglobin A1C is      elevated at 7.  4.  History of hypertension.  5.  History as previously stated in the past medical history.   PLAN:  I would continue to hold Lopressor and Atacand if blood pressure is  less than 100.  Dr. Antoine Poche will review in the morning prior to her  anticipated discharge.  I would discharge her on her preadmission  medications.  She has followup already arranged with Dr. Dorethea Clan and her primary care  physician.  I have asked her at these followups to make sure that she brings documentation of her home blood pressures and sugars for the physician's   review.      Joellyn Rued, P.A. [facility]    ______________________________  Pricilla Riffle, M.D.    EW/MEDQ  D:  11/30/2004  T:  12/01/2004  Job:  [id number]   cc:   Vida Roller, M.D.  Fax:  [phone]   Annia Friendly. Loleta Chance, MD  Fax: [phone]   Leonides Grills, M.D.  Fax: [phone]

## 2010-07-28 NOTE — Cardiovascular Report (Signed)
Bridget Rowland, Bridget Rowland NO.:  [id number]   MEDICAL RECORD NO.:  [id number]          PATIENT TYPE:  OIB   LOCATION:  6501                         FACILITY:  [facility]   PHYSICIAN:  Vida Roller, M.D.   DATE OF BIRTH:  Sep 01, 1925   DATE OF PROCEDURE:  DATE OF DISCHARGE:                              CARDIAC CATHETERIZATION   PRIMARY CARE PHYSICIAN:  Dr. Mirna Mires   HISTORY OF PRESENT ILLNESS:  Bridget Rowland is a 77 year old woman with  episodes of dizziness and near syncope who has a new left bundle branch  block on EKG.  She had Holter monitor done which showed nonsustained  ventricular tachycardia.  She had an echocardiogram which showed normal left  ventricular systolic function and after discussion with her in the office we  elected to do left heart catheterization to exclude coronary artery disease.   DETAILS OF THE PROCEDURE:  After obtaining informed consent the patient was  brought to the cardiac catheterization laboratory in the fasting state.  There she was prepped and draped in the usual sterile manner and left heart  catheterization was performed using a 4-French Judkins left #4, a 4-French  Judkins right #4, and a 4-French pigtail catheter.  The sheath used was a 10  cm 4-French sheath that was placed in the modified Seldinger technique.  The  pigtail catheter was used for left ventriculography imaged in the 30 degree  RAO view.  At the conclusion of the procedure the catheters were removed.  The patient was moved back to the cardiology holding area where the femoral  artery sheath was removed.  Hemostasis was obtained using direct manual  pressure.  At the conclusion of the hold there was no evidence of ecchymosis  of hematoma formation and distal pulses were intact.  Total fluoroscopic  time was 2.1 minutes.  Total ionized contrast use was 100 mL.   RESULTS:  Aortic pressure 170/75 with a mean arterial pressure of 112 mmHg.   Left ventricular pressure  161/10 with an end-diastolic pressure of 14 mmHg.   CORONARY ANGIOGRAPHY:  The left main coronary artery is a moderate caliber  vessel which is angiographically normal.   The left anterior descending coronary artery is a moderate caliber vessel  which is transapical and is angiographically normal.   The left circumflex coronary artery is a dominant vessel and is a small  first obtuse marginal, a large posterolateral branch, and a small posterior  descending coronary artery and it is angiographically normal.   The right coronary artery is a small, nondominant vessel which is  angiographically normal.   LEFT VENTRICULOGRAM:  The left ventriculogram reveals normal sized left  ventricle with normal ejection fraction estimated at 60-65%.  There is no  mitral regurgitation seen.   INTERPRETATION:  1.  This is normal coronary arteries, normal left ventricular systolic      function.  2.  Nonsustained ventricular tachycardia.  3.  Left dominant circulation.   PLAN:  Add a beta blocker to her medical therapy to aggressively pursue her  hypertension and also to decrease her incidence  of recurrent ventricular  tachycardia and we will see her back in my office in three to four weeks.      Trey Paula   JH/MEDQ  D:  02/10/2004  T:  02/10/2004  Job:  [id number]

## 2010-07-28 NOTE — Op Note (Signed)
NAMEKINLEE, GARRISON                 ACCOUNT NO.:  [id number]   MEDICAL RECORD NO.:  [id number]          PATIENT TYPE:  INP   LOCATION:  1518                         FACILITY:  [facility]   PHYSICIAN:  Leonides Grills, M.D.     DATE OF BIRTH:  23-Nov-1925   DATE OF PROCEDURE:  11/29/2004  DATE OF DISCHARGE:                                 OPERATIVE REPORT   PREOPERATIVE DIAGNOSES:  1.  Left hindfoot malalignment.  2.  Left tight gastrocnemius.  3.  Left posterior tibial.  4.  Left first and second tarsometatarsal joint with deformity.   POSTOPERATIVE DIAGNOSES:  1.  Left hindfoot malalignment.  2.  Left tight gastrocnemius.  3.  Left posterior tibial.  4.  Left first and second tarsometatarsal joint with deformity.   OPERATION:  1.  Left lengthening calcaneal osteotomy.  2.  Left medializing calcaneal osteotomy.  3.  Left iliac crest bone graft.  4.  Stress x-rays, left foot.  5.  Left flexor digitorum longus two navicular tendon transfer.  6.  Left gastrocnemius slide.  7.  Left first and second tarsometatarsal joint fusion with osteotomy and      iliac crest wedge bone graft.   ANESTHESIA:  General.   SURGEON:  Leonides Grills, M.D.   ASSISTANT:  Lianne Cure, P.A.   ESTIMATED BLOOD LOSS:  Minimal.   TOURNIQUET TIME:  Approximately two hours.   COMPLICATIONS:  None.   DISPOSITION:  Stable to the PR.   INDICATIONS:  This is a 77 year old female who has had longstanding mid  foot, forefoot, and high foot pain that was interfering with her life  preventing her from doing what she wants to do.  She is scheduled for the  above procedure.  All risks, which including infection, nerve or vessel  injury, nonunion, malunion, hardware irritation, hardware failure,  persistent pain, worsening pain, stiffness, arthritis, Charcot arthropathy,  deep infection, and possible below-knee amputation were all explained.  Questions were encouraged and answered.   OPERATION:  The patient was  brought to the operating room and placed in a  supine position after adequate general endotracheal tube anesthesia was  administered as well as Ancef 1 gm IV piggyback.  The left lower extremity  was then prepped and draped in a sterile manner over a proximally placed  thigh tourniquet as well as the iliac crest bone graft site.  Once the left  lower extremity was prepped and draped in a sterile manner as well as the  iliac crest bone graft site was prepped and draped in a sterile manner, we  made a longitudinal incision over the iliac crest bone graft site.  An  incision was carried down through the skin, and hemostasis was obtained.  Soft tissue dissection was carried down directly to the iliac crest.  The  soft tissue was elevated on the inner and outer table of the iliac crest.  Three wedge shaped, tricortical, trapezoidal shaped grafts were then  harvested from preoperative planning and placed on the back table.  This was  done with the sagittal saw as well as  the curved osteotome.  We then scooped  out cancellous bone and placed this on a back table as well for later  fusions.  We then copiously irrigated the area with normal saline as well as  applied Marcaine.  Deep tissues were closed with 0 Vicryl.  The subcu was  closed with 2-0 Vicryl. The skin was closed with 4-0 Monocryl subcuticular  stitch.  Steri-Strips were applied.  We then made a longitudinal incision  over the medial aspect of the gastrocnemius muscle tendinous junction.  Dissection was carried down through the skin.  Hemostasis was obtained.  The  fascia was opened wide with the incision in the conjoined region between the  gastroc soleus and developed.  The soft tissue was elevated off the  posterior aspect of the gastro.  The sural nerve was identified and  protected out of harm's way throughout the entire procedure.  The tight  gastroc was then released with the curved Mayo scissors.  This had an  excellent release  of the tight gastroc.  The area was copiously irrigated  with normal saline.  The subcu was closed with 3-0 Vicryl.  The skin was  closed with 4-0 Monocryl.  The subcuticular Steri-Strips were applied.  The  left lower extremity was then gravity-exsanguinated.  Tourniquet was  elevated to 290 mmHg.  A longitudinal incision over the posterior tibial  tendon was then made.  Dissection was carried down through the skin.  Hemostasis was obtained.  The posterior tibial tendon was then identified.  There was no distinct tear within the posterior tibial tendon.  There was  tendinopathy but not too significant.  Because of this, we decided to keep  the tendon and not excise it.  We then identified the FDL tendon and traced  this to the knot of Henry and tenotomized this.  Hemostasis was obtained.  We did not counter any significant venous bleeding.  We left the FDL tendon  in the wound, and this was transferred after the lengthening calcaneal  osteotomy and medializing calcaneal osteotomy was performed.  The closure  was to the posterior tibial tendon navicular attachment of the stump with #2  FiberWire.  This had an excellent transfer as well.  The area was copiously  irrigated with normal saline.  The retinaculum was closed with 2-0 Vicryl.  The subcu was closed with 3-0 Vicryl.   We then went to the lateral aspect of the foot, where a longitudinal  incision was made over the neck of the calcaneus.  Dissection was carried  down through skin.  Hemostasis was obtained.  Peroneal tendons were  identified and retracted out of harm's way.  The soft tissue was elevated  over the superior and inferior aspects of the calcaneal neck.  Approximately  1.2 cm proximal to the CC joint, we then made an osteotomy parallel to the  CC joint, perpendicular to the lateral wall of the calcaneus.  Once this was done, a small laminar spreader was applied.  Tricortical bone grafting that  was harvested earlier was  then placed into the lengthening osteotomy site  and tamped into place.  We then fixed this with a 3.5 mm fully threaded  cortical set screw using 1.5 mm drill holes respectively.  This had  excellent purchase and maintenance of the correction.  The area was  copiously irrigated with normal saline.  We then made a longitudinal  incision to the lateral aspect of the calcaneal tuber.  Dissection was  carried down  through the skin.  Hemostasis was obtained.  Dissection was  carried down directly to bone.  Soft tissue was elevated on the superior and  inferior aspects of the calcaneal tuber.  Holman retractors were then  applied.  A sagittal saw was then used to create the osteotomy perpendicular  to the calcaneal pitch and perpendicular to the lateral wall of the  calcaneus.  The heel was then translated proximally 8 mm medially and then  provisionally fixed with a 2 mm K wire.  We then made a longitudinal  incision in the posterior central aspect of the heel.  Dissection was  carried down directly to bone.  Two 6.5 mm, 16 mm threaded 50 mm long  cancellous screws were then placed using 4.5 and 3.2 mm drill holes  respectively.  The K wire was then removed.  The areas were copiously with  normal saline.  We then, as described earlier, went back to the medial  aspect of the ankle and did the tendon transfer, as described above.  Once  this was completed, we then obtained x-rays in the AP, lateral, and axial  planes, and this showed the fixation and correction were excellent.  The  wounds were copiously irrigated with normal saline.  We then made a  longitudinal incision just lateral to the EHL tendon.  Dissection was  carried out through the skin.  Hemostasis was obtained.  Neurovascular  structures were identified and retracted out of harm's way laterally.  The  first and second TMT joints were encountered.  There was a tremendous amount  of erosion dorsally.  Once this was completely debrided  and the joint was  debrided of the remaining cartilage on either side, multiple 2 mm drill  holes were placed on either side of the joint.  We then obtained the iliac  crest bone graft and packed the cancellous in first after an osteotomy was  created in the medial cuneiform to try to correct not only the slight valgus  alignment to the deformity but also some plantar flexion correction as well.  Once the cancellous bone was packed after the osteotomy, we then packed in  dorsally the tricortical bone block.  This held the toe in an excellent  position, and we then fixed this with a two point reduction clamp and then  approximately 1.5 cm distal to the first TMT joint created a notch with the  bur and we then placed a two point reduction clamp across the second TMT  joint and then placed through a percutaneous incision medially a 3.5 mm fully threaded cortical set screw using a 3.5 mm fully threaded cortical  screw using a 2.5 mm drill hole respectively.  This had excellent purchase  and maintenance of the correction.  We then placed a screw from the medial  cuneiform to the base of the first metatarsal, dorsal plantar, again using  2.5 mm fully threaded cortical set using a 2.5 mm drill hole respectively.  This had excellent purchase and maintenance of the correction.  Final x-rays  were obtained in three views of the forefoot and stress x-rays that showed  no gross motion across the fusion sites.  Fixation and proper position,  alignment, and excellent position as well.  The tourniquet was deflated.  Hemostasis was obtained.  There was no pulsatile bleeding.  The subcu was  closed with 3-0 Vicryl.  The skin was closed with 4-0 nylon over all wounds  after they were all copiously irrigated with  normal saline.  A sterile  dressing was applied.  Modified Jones dressing was applied.  The patient was  stable to the PR.      Leonides Grills, M.D.  Electronically Signed     PB/MEDQ  D:   11/29/2004  T:  11/29/2004  Job:  [id number]

## 2010-07-28 NOTE — Discharge Summary (Signed)
Bridget Rowland, Bridget Rowland NO.:  [id number]   MEDICAL RECORD NO.:  [id number]          PATIENT TYPE:  INP   LOCATION:  1518                         FACILITY:  [facility]   PHYSICIAN:  Leonides Grills, M.D.     DATE OF BIRTH:  [birth year]   DATE OF ADMISSION:  11/29/2004  DATE OF DISCHARGE:  12/01/2004                                 DISCHARGE SUMMARY   She was admitted postoperatively.   ADMISSION DIAGNOSIS:  1.  Left foot and ankle tight gastrocnemius, posterior tibial tendinitis,      first second tarsometatarsal joint arthritis.  2.  Hypertension.  3.  Diabetes type 2.  4.  Chronic back pain.   DISCHARGE DIAGNOSES:  1.  Hypertension.  2.  Diabetes type 2.  3.  Chronic back pain.  4.  Status post foot surgery on the left foot, gastrocnemius slide, first      and second tarsometatarsal joint fusion with osteotomy.   BRIEF STAY:  She was admitted on November 29, 2004 postoperatively for pain  control, observation.   On postop day #1, November 29, 2004, she was alert and oriented x3.  Pain  was well controlled.  She had blood pressure of 98/58, pulse 66.  Distally,  the bilateral lower extremities, neurovascular and motor intact.  Left lower  extremity intact.  Clean and dry.  No active drainage.  Capillary refill is  brisk.  Right calf:  Soft to palpation and nontender.  TED hose in place.  Regular heart rate and rhythm.  Lungs clear to auscultation bilaterally.  Status post left foot reconstruction with iliac bone graft.  Nonweightbearing.  Has foot elevated.  We are going to hold her blood  pressure medicine whenever her blood pressure is 110/60 or lower and pulse  is less than or equal to 60.  We did get a medicine consult, which was done  on December 01, 2004 to help with blood pressure control.  Basically, they  suggested to hold blood pressure medicine and then resume the next a.m. as  tolerated using the same parameters that were just mentioned.   On  postop day #2, patient was alert and oriented.  Blood pressure stable at  114/54.  White count 11.6, hemoglobin 9.6.  Asymptomatic.  The left hip  dressing is clean, dry, and intact.  Range of motion of the toes intact.  Capillary refill is brisk.  Sensation is intact to light touch is grossly  intact.  Right calf is soft, nontender to palpation.  TED hose intact.  Status post left foot reconstruction with iliac bone graft.  She is going to  receive home health physical therapy, and she is going to be discharged home  on December 01, 2004 and follow up with Dr. Leonides Grills in the office in  two weeks.  Home health is going to be provided by [facility].  She  will also follow up with her primary care doctor in 1-2 weeks to check her  blood pressure status and medications.   Preoperative labs include a white cell count of 14.6,  red blood cell count  of 3.17, hemoglobin 10.5, hematocrit 29.7, platelets 264.  PT was 14 and INR  1.1.  Sodium 135, potassium 5.8, glucose 173.  Urinalysis was negative.   She did get ECG study which shows a normal sinus rhythm, left axis  deviation, left bundle branch block.  Abnormal ECG unconfirmed.   She did not have x-rays in the hospital.  They were brought from our office  by Dr. Leonides Grills.   Upon discharge, she was sent home with Percocet 5/325 1-2 q.4-6h. as needed  for pain control, and she is going to continue all current medication, which  includes potassium, Senokot p.r.n., Glucovance 2.5/250 b.i.d., Atacand 4 mg  daily, Ditropan 5 mg b.i.d., Lopressor 50 mg b.i.d., amitriptyline 25 mg  q.h.s., Lipitor 40 mg q.h.s., Actos 15 mg daily, calcium, magnesium, and  zinc daily, aspirin 325 daily.   STATUS:  Stable.   DIET:  Regular.      Lianne Cure, P.A.      Leonides Grills, M.D.  Electronically Signed    MC/MEDQ  D:  12/20/2004  T:  12/20/2004  Job:  [id number]

## 2010-07-28 NOTE — Procedures (Signed)
NAMEALEILA, SYVERSON                 ACCOUNT NO.:  [id number]   MEDICAL RECORD NO.:  [id number]           PATIENT TYPE:  REC   LOCATION:  RESP                          FACILITY:  [facility]   PHYSICIAN:  Peter C. Eden Emms, MD, FACCDATE OF BIRTH:  1925-08-12   DATE OF PROCEDURE:  DATE OF DISCHARGE:                                ECHOCARDIOGRAM   PATIENT:  Bridget Rowland.   INDICATIONS FOR PROCEDURE:  History of hypertension and syncope.   DESCRIPTION OF PROCEDURE:  The left ventricular cavity size was normal.  There was mild left ventricular dysfunction.  There was abnormal septal  motion.  The ejection fraction was 60%.  The mitral valve was mildly  thickened.  Trivial MR.  The aortic valve was tri-leaflet and sclerotic.  There is no significant stenosis.  There is mild left atrial enlargement  and right-sided cardiac chambers were normal.  Subcostal imaging  revealed no atrial septal defect, no source of embolus, no effusion.   M-mode measurements include an aortic diameter of 24 mm, left atrial  dimension of 38 mm, septal thickness 14 mm.  LV dimension in diastole  was 37 mm.  LV dimension in systole was 24 mm.   The peak aortic velocity was 1.7 meters per second with a peak gradient  of 12 mmHg and a mean gradient of 9.   FINAL IMPRESSION:  1. Mild left ventricular dysfunction, abnormal septal motion, ejection      fraction of 60%.  2. Very mild aortic stenosis.  3. Trivial mitral regurgitation.  4. Mild left atrial enlargement.  5. Normal right-sided cardiac chambers.  6. No pericardial effusion.      Noralyn Pick. Eden Emms, MD, [facility]  Electronically Signed     PCN/MEDQ  D:  05/22/2006  T:  05/24/2006  Job:  [id number]   cc:   Annia Friendly. Loleta Chance, MD  Fax: [phone]

## 2010-07-28 NOTE — Assessment & Plan Note (Signed)
[facility]                       [facility] OFFICE NOTE   Bridget Rowland, Bridget Rowland                        MRN:          [id number]  DATE:05/07/2006                            DOB:          1925-11-10    Bridget Rowland is a delightful 77 year old patient referred for an episode  of presyncope.   Around Valentine's Day she had been outside walking.  She came back into  the house.  She actually does not describe any passing out feeling, but  more a feeling of not knowing where she was or what she was doing.  She  lives by herself and did not notice any difficulty with her speech.  She  could use all her arms and legs but did not feel right for 15 to 20  minutes.  She did not actually pass out.  She has had a chronic history  of palpitations.  She does get them on a weekly basis.   She did not have any associated shortness of breath, chest pain. The  patient has had increasing weakness over the last 6 months to a year.   She has had previous cardiac workup by Dr. Anthonette Legato.  She had a heart cath  back in December 2005, which showed no significant coronary artery  disease.  Ventriculography showed an EF of 60% to 65% percent.   This was initially done in the setting non-sustained VT in the new left  bundle branch block.   The patient also had an echo done on January 20, 2004 which showed good  LV function and no significant valve disease.  She had a Holter monitor  in November 2005 as well with 3 episodes of symptomatic nonsustained VT  with ventricular rates up to 155.  The patient has not had any recurrent  episodes since February 14.   She described this episode to Dr. Loleta Chance who referred her to our clinic.   The patient's review of systems otherwise remarkable primarily for her  weakness.   Her medications include:  1. Calcium tablets.  2. Lopressor 25 daily.  3. Diovan 160 daily.  4. Metformin 1 gram b.i.d.  5. Glipizide 5 b.i.d.  6. An  aspirin daily.  7. Simvastatin 40 daily.  8. Amitriptyline 250 daily.   PAST MEDICAL HISTORY:  Is otherwise remarkable for hypertension and  diabetes.   She lives by herself.  She has a daughter in [place] who checks on  her frequently.  She also has a son.  She is retired.  Her husband is  still alive but in a nursing home and she sees him on a daily basis as  he is fairly frail and on dialysis.   She is otherwise sedentary.   She does not smoke or drink.   She denies any allergies.   FAMILY HISTORY:  Is noncontributory.   EXAMINATION:  She is slightly pale.  The blood pressure is equal to 110/64.  Pulse is 67 and regular.  HEENT:  Is normal.  There is no thyromegaly and no lymphadenopathy. She has a left carotid  bruit.  Her lungs are  clear.  There is an S1, S2 with a soft systolic murmur.  ABDOMEN:  Was benign.  Lower extremity intact pulses. No edema.   EKG shows sinus from the left bundle branch block and a heart rate of  67.   IMPRESSION:  Patient's episode sounds more neurologically related.   She has a left carotid bruit and I think she should have a carotid  duplex study.   However, she does have a left bundle branch block and a  history of  nonsustained ventricular tachycardia.  I think it would be reasonable to  repeat a cardiac workup in terms of an adenosine Myoview  and echo to  rule out any new structural heart disease or worsening left ventricular  function that could make her palpitations and history of nonsustained  ventricular tachycardia more pertinent.   We will also give her a CardioNet monitor for 3 weeks to see if we can  capture any arrhythmias.   We will have to see how her blood pressure runs; however, currently in  the office she was not postural but only had a systolic pressure of 100.  We may need to cut back on her Diovan.  Given her diabetes it is  probably reasonable to keep her on a low dose of an ARB for its renal  protective  effects.   Overall I think  her quality of life is good, but she has several issues  to look into in regards to her left bundle branch block, history of  nonsustained ventricular tachycardia, questioned transient neurological  symptoms left carotid bruit.   Further recommendation will be based on the results of these tests and  followups.     Bridget Rowland. Eden Emms, MD, [facility]  Electronically Signed    PCN/MedQ  DD: 05/07/2006  DT: 05/07/2006  Job #: [id number]   cc:   Annia Friendly. Loleta Chance, MD

## 2010-10-12 ENCOUNTER — Other Ambulatory Visit (HOSPITAL_COMMUNITY): Payer: Self-pay | Admitting: Urology

## 2010-10-12 DIAGNOSIS — N390 Urinary tract infection, site not specified: Secondary | ICD-10-CM

## 2010-10-17 ENCOUNTER — Other Ambulatory Visit (HOSPITAL_COMMUNITY): Payer: Self-pay | Admitting: Urology

## 2010-10-17 ENCOUNTER — Ambulatory Visit (HOSPITAL_COMMUNITY)
Admission: RE | Admit: 2010-10-17 | Discharge: 2010-10-17 | Disposition: A | Discharge status: Home / Self Care | Payer: Medicare HMO | Source: Ambulatory Visit | Attending: Urology | Admitting: Urology

## 2010-10-17 DIAGNOSIS — N390 Urinary tract infection, site not specified: Secondary | ICD-10-CM

## 2010-10-17 DIAGNOSIS — I10 Essential (primary) hypertension: Secondary | ICD-10-CM | POA: Insufficient documentation

## 2010-10-17 DIAGNOSIS — R319 Hematuria, unspecified: Secondary | ICD-10-CM | POA: Insufficient documentation

## 2010-10-17 DIAGNOSIS — E119 Type 2 diabetes mellitus without complications: Secondary | ICD-10-CM | POA: Insufficient documentation

## 2010-10-24 ENCOUNTER — Encounter: Payer: Self-pay | Admitting: *Deleted

## 2010-11-17 ENCOUNTER — Ambulatory Visit (INDEPENDENT_AMBULATORY_CARE_PROVIDER_SITE_OTHER): Payer: Medicare HMO | Admitting: *Deleted

## 2010-11-17 ENCOUNTER — Encounter: Payer: Self-pay | Admitting: Internal Medicine

## 2010-11-17 DIAGNOSIS — I442 Atrioventricular block, complete: Secondary | ICD-10-CM

## 2010-11-17 LAB — PACEMAKER DEVICE OBSERVATION
AL AMPLITUDE: 0.9 mv
AL IMPEDENCE PM: 315 Ohm
AL THRESHOLD: 1 V
BAMS-0001: 150 {beats}/min
BAMS-0003: 70 {beats}/min
BATTERY VOLTAGE: 2.76 V
DEVICE MODEL PM: 1209038
RV LEAD AMPLITUDE: 12 mv
RV LEAD THRESHOLD: 0.5 V

## 2010-11-24 ENCOUNTER — Ambulatory Visit: Payer: Medicare HMO | Admitting: Adult Health

## 2010-12-18 ENCOUNTER — Encounter: Payer: Self-pay | Admitting: Cardiovascular Disease

## 2011-02-21 ENCOUNTER — Ambulatory Visit (HOSPITAL_COMMUNITY)
Admission: RE | Admit: 2011-02-21 | Discharge: 2011-02-21 | Disposition: A | Discharge status: Home / Self Care | Payer: Medicare HMO | Source: Ambulatory Visit | Attending: Family Medicine | Admitting: Family Medicine

## 2011-02-21 ENCOUNTER — Other Ambulatory Visit (HOSPITAL_COMMUNITY): Payer: Self-pay | Admitting: Family Medicine

## 2011-02-21 DIAGNOSIS — R05 Cough: Secondary | ICD-10-CM

## 2011-02-21 DIAGNOSIS — R059 Cough, unspecified: Secondary | ICD-10-CM | POA: Insufficient documentation

## 2011-05-11 ENCOUNTER — Encounter: Payer: Self-pay | Admitting: Internal Medicine

## 2011-05-15 ENCOUNTER — Encounter: Payer: Self-pay | Admitting: Internal Medicine

## 2011-05-15 ENCOUNTER — Ambulatory Visit (INDEPENDENT_AMBULATORY_CARE_PROVIDER_SITE_OTHER): Payer: Medicare Other | Admitting: Internal Medicine

## 2011-05-15 DIAGNOSIS — I498 Other specified cardiac arrhythmias: Secondary | ICD-10-CM | POA: Insufficient documentation

## 2011-05-15 DIAGNOSIS — I10 Essential (primary) hypertension: Secondary | ICD-10-CM

## 2011-05-15 DIAGNOSIS — Z950 Presence of cardiac pacemaker: Secondary | ICD-10-CM

## 2011-05-15 LAB — PACEMAKER DEVICE OBSERVATION
AL IMPEDENCE PM: 326 Ohm
AL THRESHOLD: 0.5 V
BATTERY VOLTAGE: 2.78 V
RV LEAD AMPLITUDE: 12 mv
VENTRICULAR PACING PM: 1

## 2011-05-15 NOTE — Patient Instructions (Signed)
**Note De-identified Searra Carnathan Obfuscation** Your physician recommends that you schedule a follow-up appointment in: 1 year  

## 2011-05-16 ENCOUNTER — Encounter: Payer: Self-pay | Admitting: Internal Medicine

## 2011-05-16 NOTE — Progress Notes (Signed)
HPI Bridget Rowland returns today for followup. She is a pleasant 78 yo woman with a h/o symptomatic bradycardia s/p PPM, HTN, and DM. She remains active, going dancing several times a week. She denies c/p, sob, or peripheral edema.  Allergies  Allergen Reactions  . Actos (Pioglitazone Hydrochloride)      Current Outpatient Prescriptions  Medication Sig Dispense Refill  . aspirin 81 MG tablet Take 81 mg by mouth daily.        Marland Kitchen glipiZIDE (GLUCOTROL) 5 MG tablet Take 5 mg by mouth 2 (two) times daily before a meal. 1 po in the am and 2 po in the pm       . metFORMIN (GLUCOPHAGE) 1000 MG tablet Take 1,000 mg by mouth 2 (two) times daily with a meal.        . metoprolol succinate (TOPROL-XL) 25 MG 24 hr tablet Take 25 mg by mouth daily.        . Omega-3 Fatty Acids (FISH OIL) 1000 MG CAPS Take 1,000 mg by mouth daily.        Marland Kitchen oxybutynin (DITROPAN-XL) 5 MG 24 hr tablet Take 5 mg by mouth 2 (two) times daily.        Marland Kitchen POTASSIUM GLUCONATE PO Take by mouth daily.        . simvastatin (ZOCOR) 40 MG tablet Take 40 mg by mouth at bedtime.        . valsartan-hydrochlorothiazide (DIOVAN-HCT) 160-12.5 MG per tablet Take 1 tablet by mouth daily.           Past Medical History  Diagnosis Date  . Diabetes mellitus   . Hypertension   . CAD (coronary artery disease)   . Other specified cardiac dysrhythmias     ROS:   All systems reviewed and negative except as noted in the HPI.   Past Surgical History  Procedure Date  . Cardiac catheterization   . Pacemaker insertion 10/30/2007    [employer] Zephyr dual lead pacemaker     No family history on file.   History   Social History  . Marital Status: Married    Spouse Name: N/A    Number of Children: N/A  . Years of Education: N/A   Occupational History  . Retired    Social History Main Topics  . Smoking status: Unknown If Ever Smoked  . Smokeless tobacco: Not on file  . Alcohol Use: No  . Drug Use: No  . Sexually Active: Not on file     Other Topics Concern  . Not on file   Social History Narrative   No regular exercise      BP 160/64  Pulse 59  Ht 5\' 2"  (1.575 m)  Wt 53.978 kg (119 lb)  BMI 21.77 kg/m2  Physical Exam:  Well appearing elderly woman, NAD HEENT: Unremarkable Neck:  No JVD, no thyromegally Lymphatics:  No adenopathy Back:  No CVA tenderness Lungs:  Clear with no wheezes, rales, or rhonchi. HEART:  Regular rate rhythm, no murmurs, no rubs, no clicks Abd:  soft, positive bowel sounds, no organomegally, no rebound, no guarding Ext:  2 plus pulses, no edema, no cyanosis, no clubbing Skin:  No rashes no nodules Neuro:  CN II through XII intact, motor grossly intact  DEVICE  Normal device function.  See PaceArt for details.   Assess/Plan:

## 2011-05-16 NOTE — Assessment & Plan Note (Signed)
Her device is working normally. Will recheck in several months. 

## 2011-05-16 NOTE — Assessment & Plan Note (Signed)
Her systolic pressure is elevated. I have asked her to reduce her sodium intake. She will continue her current meds.

## 2011-07-09 ENCOUNTER — Telehealth: Payer: Self-pay

## 2011-07-09 NOTE — Telephone Encounter (Signed)
Called pt. She is preparing to go out of town for a few days. She will call when she returns. Said she had a previous colonoscopy many years ago at [facility] by Dr. Katrinka Blazing. I will check on that report.

## 2011-07-12 NOTE — Telephone Encounter (Signed)
Report from [facility], pt had colonoscopy 09/02/2003 by Dr. Katrinka Blazing and he recommended the next in 5 years.

## 2011-07-17 ENCOUNTER — Telehealth: Payer: Self-pay

## 2011-07-17 ENCOUNTER — Other Ambulatory Visit: Payer: Self-pay

## 2011-07-17 DIAGNOSIS — Z139 Encounter for screening, unspecified: Secondary | ICD-10-CM

## 2011-07-17 NOTE — Telephone Encounter (Addendum)
Gastroenterology Pre-Procedure Form  Pt has Pacemaker x 3 years/ Dr. Ladona Ridgel is her cardiologist   Request Date: 07/17/2011        Requesting Physician: Dr. Mirna Mires     PATIENT INFORMATION:  Bridget Rowland is a 78 y.o., female (DOB=[birth year]).  PROCEDURE: Procedure(s) requested: colonoscopy Procedure Reason: screening for colon cancer  PATIENT REVIEW QUESTIONS: The patient reports the following:   1. Diabetes Melitis: yes  2. Joint replacements in the past 12 months: no 3. Major health problems in the past 3 months: no 4. Has an artificial valve or MVP:no 5. Has been advised in past to take antibiotics in advance of a procedure like teeth cleaning: no}    MEDICATIONS & ALLERGIES:    Patient reports the following regarding taking any blood thinners:   Plavix? no Aspirin?yes  Coumadin?  no  Patient confirms/reports the following medications:  Current Outpatient Prescriptions  Medication Sig Dispense Refill  . aspirin 81 MG tablet Take 81 mg by mouth daily.        Marland Kitchen glipiZIDE (GLUCOTROL) 5 MG tablet Take 5 mg by mouth 3 (three) times daily. 1 po in the am and 2 po in the pm      . losartan (COZAAR) 100 MG tablet Take 100 mg by mouth daily.      . metFORMIN (GLUCOPHAGE) 1000 MG tablet Take 1,000 mg by mouth 2 (two) times daily with a meal.        . metoprolol succinate (TOPROL-XL) 25 MG 24 hr tablet Take 25 mg by mouth daily.        . Omega-3 Fatty Acids (FISH OIL) 1000 MG CAPS Take 1,000 mg by mouth daily.        Marland Kitchen oxybutynin (DITROPAN-XL) 5 MG 24 hr tablet Take 5 mg by mouth 2 (two) times daily.        . simvastatin (ZOCOR) 40 MG tablet Take 40 mg by mouth at bedtime.        . valsartan-hydrochlorothiazide (DIOVAN-HCT) 160-12.5 MG per tablet Take 1 tablet by mouth daily.        Marland Kitchen POTASSIUM GLUCONATE PO Take by mouth daily.          Patient confirms/reports the following allergies:  Allergies  Allergen Reactions  . Actos (Pioglitazone Hydrochloride)     Patient is  appropriate to schedule for requested procedure(s): yes  AUTHORIZATION INFORMATION Primary Insurance:   ID #:  Group #:  Pre-Cert / Auth required: Pre-Cert / Auth #:   Secondary Insurance:  ID #:  Group #:  Pre-Cert / Auth required:  Pre-Cert / Auth #:   No orders of the defined types were placed in this encounter.    SCHEDULE INFORMATION: Procedure has been scheduled as follows:  Date: 08/22/2011              Time:  7:30 AM Location: [facility] Short Stay  This Gastroenterology Pre-Precedure Form is being routed to the following provider(s) for review: R. Roetta Sessions, MD    Rx and instructions mailed to pt.

## 2011-07-17 NOTE — Telephone Encounter (Signed)
Dr. Katrinka Blazing also said it was a normal colonoscopy, no masses or polyps found.

## 2011-07-17 NOTE — Telephone Encounter (Signed)
OK to proceed with colonoscopy  Take half diabetes medications (Glucotrol and Glucophage) the day prior to the colonoscopy (date of prep)  Early morning appointment-hold diabetes medications day of procedure  Bring all medications and/or any insulin to the hospital the day of the procedure.  Follow blood sugars, call us or your PCP if any problems.     

## 2011-08-14 ENCOUNTER — Encounter (HOSPITAL_COMMUNITY): Payer: Self-pay | Admitting: Pharmacy Technician

## 2011-08-15 ENCOUNTER — Telehealth: Payer: Self-pay

## 2011-08-15 NOTE — Telephone Encounter (Signed)
OK to proceed with colonoscopy  Take half diabetes medications (Glucotrol and Glucophage) the day prior to the colonoscopy (date of prep)  Early morning appointment-hold diabetes medications day of procedure  Bring all medications and/or any insulin to the hospital the day of the procedure.  Follow blood sugars, call us or your PCP if any problems.

## 2011-08-15 NOTE — Telephone Encounter (Signed)
Called pt. She has not had any new problems and no change in meds.

## 2011-08-15 NOTE — Telephone Encounter (Signed)
Pt returned call- please call her back at [phone]

## 2011-08-15 NOTE — Telephone Encounter (Signed)
Pt is aware of all of her instructions.

## 2011-08-15 NOTE — Telephone Encounter (Signed)
LMOM for a return call to review meds prior to colonoscopy on 08/22/2011.

## 2011-08-21 MED ORDER — SODIUM CHLORIDE 0.45 % IV SOLN
Freq: Once | INTRAVENOUS | Status: AC
  Administered 2011-08-22: 1000 mL via INTRAVENOUS

## 2011-08-22 ENCOUNTER — Encounter (HOSPITAL_COMMUNITY)
Admission: RE | Disposition: A | Discharge status: Home / Self Care | Payer: Self-pay | Source: Ambulatory Visit | Attending: Internal Medicine

## 2011-08-22 ENCOUNTER — Ambulatory Visit (HOSPITAL_COMMUNITY)
Admission: RE | Admit: 2011-08-22 | Discharge: 2011-08-22 | Disposition: A | Discharge status: Home / Self Care | Payer: Medicare Other | Source: Ambulatory Visit | Attending: Internal Medicine | Admitting: Internal Medicine

## 2011-08-22 ENCOUNTER — Encounter (HOSPITAL_COMMUNITY): Payer: Self-pay | Admitting: *Deleted

## 2011-08-22 DIAGNOSIS — K648 Other hemorrhoids: Secondary | ICD-10-CM

## 2011-08-22 DIAGNOSIS — Z01812 Encounter for preprocedural laboratory examination: Secondary | ICD-10-CM | POA: Insufficient documentation

## 2011-08-22 DIAGNOSIS — Z139 Encounter for screening, unspecified: Secondary | ICD-10-CM

## 2011-08-22 DIAGNOSIS — I10 Essential (primary) hypertension: Secondary | ICD-10-CM | POA: Insufficient documentation

## 2011-08-22 DIAGNOSIS — Z1211 Encounter for screening for malignant neoplasm of colon: Secondary | ICD-10-CM | POA: Insufficient documentation

## 2011-08-22 DIAGNOSIS — E119 Type 2 diabetes mellitus without complications: Secondary | ICD-10-CM | POA: Insufficient documentation

## 2011-08-22 DIAGNOSIS — Z950 Presence of cardiac pacemaker: Secondary | ICD-10-CM | POA: Insufficient documentation

## 2011-08-22 DIAGNOSIS — Z79899 Other long term (current) drug therapy: Secondary | ICD-10-CM | POA: Insufficient documentation

## 2011-08-22 HISTORY — PX: COLONOSCOPY: SHX5424

## 2011-08-22 LAB — GLUCOSE, CAPILLARY: Glucose-Capillary: 125 mg/dL — ABNORMAL HIGH (ref 70–99)

## 2011-08-22 SURGERY — COLONOSCOPY
Anesthesia: Moderate Sedation

## 2011-08-22 MED ORDER — EPINEPHRINE HCL 0.1 MG/ML IJ SOLN
INTRAMUSCULAR | Status: AC
  Filled 2011-08-22: qty 10

## 2011-08-22 MED ORDER — MEPERIDINE HCL 100 MG/ML IJ SOLN
INTRAMUSCULAR | Status: AC
  Filled 2011-08-22: qty 2

## 2011-08-22 MED ORDER — MIDAZOLAM HCL 5 MG/5ML IJ SOLN
INTRAMUSCULAR | Status: AC
  Filled 2011-08-22: qty 10

## 2011-08-22 MED ORDER — STERILE WATER FOR IRRIGATION IR SOLN
Status: DC | PRN
  Administered 2011-08-22: 09:00:00

## 2011-08-22 MED ORDER — MIDAZOLAM HCL 5 MG/5ML IJ SOLN
INTRAMUSCULAR | Status: DC | PRN
  Administered 2011-08-22 (×3): 1 mg via INTRAVENOUS

## 2011-08-22 MED ORDER — MIDAZOLAM HCL 2 MG/2ML IJ SOLN
INTRAMUSCULAR | Status: AC
  Filled 2011-08-22: qty 4

## 2011-08-22 MED ORDER — MEPERIDINE HCL 100 MG/ML IJ SOLN
INTRAMUSCULAR | Status: DC | PRN
  Administered 2011-08-22 (×3): 25 mg via INTRAVENOUS

## 2011-08-22 NOTE — H&P (Signed)
Primary Care Physician:  Evlyn Courier, MD Primary Gastroenterologist:  Dr.   Pre-Procedure History & Physical: HPI:  Bridget Rowland is a 78 y.o. female is here for a screening colonoscopy. Average risk screening examination. Last colonoscopy reportedly 8 years ago. No bowel symptoms. Family history of colon cancer colon polyps. Patient denies personal history of colon polyps.  Past Medical History  Diagnosis Date  . Diabetes mellitus   . Hypertension   . CAD (coronary artery disease)   . Other specified cardiac dysrhythmias     Past Surgical History  Procedure Date  . Cardiac catheterization   . Pacemaker insertion 10/30/2007    St. Jude Zephyr dual lead pacemaker    Prior to Admission medications   Medication Sig Start Date End Date Taking? Authorizing Provider  aspirin 81 MG tablet Take 81 mg by mouth every morning.    Yes Historical Provider, MD  glipiZIDE (GLUCOTROL) 5 MG tablet Take 5-10 mg by mouth 2 (two) times daily. 1 tablet in the morning and two tablets in the afternoon   Yes Historical Provider, MD  metFORMIN (GLUCOPHAGE) 1000 MG tablet Take 1,000 mg by mouth 2 (two) times daily with a meal.     Yes Historical Provider, MD  metoprolol succinate (TOPROL-XL) 25 MG 24 hr tablet Take 25 mg by mouth every morning.    Yes Historical Provider, MD  Omega-3 Fatty Acids (FISH OIL) 1000 MG CAPS Take 1,000 mg by mouth every evening.    Yes Historical Provider, MD  oxybutynin (DITROPAN) 5 MG tablet Take 5 mg by mouth 2 (two) times daily.   Yes Historical Provider, MD  simvastatin (ZOCOR) 40 MG tablet Take 40 mg by mouth at bedtime.     Yes Historical Provider, MD  valsartan-hydrochlorothiazide (DIOVAN-HCT) 160-12.5 MG per tablet Take 1 tablet by mouth every morning.    Yes Historical Provider, MD    Allergies as of 07/17/2011 - Review Complete 05/16/2011  Allergen Reaction Noted  . Actos (pioglitazone hydrochloride)  11/17/2010    No family history on file.  History    Social History  . Marital Status: Married    Spouse Name: N/A    Number of Children: N/A  . Years of Education: N/A   Occupational History  . Retired    Social History Main Topics  . Smoking status: Unknown If Ever Smoked  . Smokeless tobacco: Not on file  . Alcohol Use: No  . Drug Use: No  . Sexually Active: Not on file   Other Topics Concern  . Not on file   Social History Narrative   No regular exercise     Review of Systems: See HPI, otherwise negative ROS  Physical Exam: BP 156/52  Pulse 60  Temp 97.5 F (36.4 C) (Oral)  Resp 24  SpO2 98% General:   Alert,  elderly, Well-developed, well-nourished, pleasant and cooperative in NAD Head:  Normocephalic and atraumatic. Eyes:  Sclera clear, no icterus.   Conjunctiva pink. Ears:  Normal auditory acuity. Nose:  No deformity, discharge,  or lesions. Mouth:  No deformity or lesions, dentition normal. Neck:  Supple; no masses or thyromegaly. Lungs:  Clear throughout to auscultation.   No wheezes, crackles, or rhonchi. No acute distress. Heart:  Regular rate and rhythm; no murmurs, clicks, rubs,  or gallops. Abdomen:  Soft, nontender and nondistended. No masses, hepatosplenomegaly or hernias noted. Normal bowel sounds, without guarding, and without rebound.   Msk:  Symmetrical without gross deformities. Normal posture. Pulses:  Normal  pulses noted. Extremities:  Without clubbing or edema. Neurologic:  Alert and  oriented x4;  grossly normal neurologically. Skin:  Intact without significant lesions or rashes. Cervical Nodes:  No significant cervical adenopathy. Psych:  Alert and cooperative. Normal mood and affect.  Impression/Plan: Bridget Rowland is now here to undergo a screening colonoscopy.  Average risk screening examination. No bowel symptoms.  Risks, benefits, limitations, imponderables and alternatives regarding colonoscopy have been reviewed with the patient. Questions have been answered. All parties  agreeable.

## 2011-08-22 NOTE — Op Note (Signed)
[facility] [address] [place], [place]  [address]  COLONOSCOPY PROCEDURE REPORT  PATIENT:  Bridget, Rowland  MR#:  [id number] BIRTHDATE:  [birth year], 86 yrs. old  GENDER:  female ENDOSCOPIST:  R. Roetta Sessions, MD FACP [facility] REF. BY:          Mirna Mires, M.D. PROCEDURE DATE:  08/22/2011 PROCEDURE:  Screening colonoscopy  INDICATIONS:  average risk colorectal cancer screening  INFORMED CONSENT:  The risks, benefits, alternatives and imponderables including but not limited to bleeding, perforation as well as the possibility of a missed lesion have been reviewed. The potential for biopsy, lesion removal, etc. have also been discussed.  Questions have been answered.  All parties agreeable. Please see the history and physical in the medical record for more information.  MEDICATIONS:  IV Versed and Demerol incremental doses.  DESCRIPTION OF PROCEDURE:  After a digital rectal exam was performed, the EC-[id number] ([id number]) colonoscope was advanced from the anus through the rectum and colon to the area of the cecum, ileocecal valve and appendiceal orifice.  The cecum was deeply intubated.  These structures were well-seen and photographed for the record.  From the level of the cecum and ileocecal valve, the scope was slowly and cautiously withdrawn.  The mucosal surfaces were carefully surveyed utilizing scope tip deflection to facilitate fold flattening as needed.  The scope was pulled down into the rectum where a thorough examination including retroflexion was performed. <<PROCEDUREIMAGES>>  FINDINGS:     adequate preparation. Anal papilla and minimal internal hemorrhoids; otherwise normal rectum. Normal appearing colonic mucosa.  THERAPEUTIC / DIAGNOSTIC MANEUVERS PERFORMED:none  COMPLICATIONS:  none  CECAL WITHDRAWAL TIME:10 minutes  IMPRESSION:  Anal papilla and internal hemorrhoids otherwise normal rectum and colon  RECOMMENDATIONS:   No further screening  examinations indicated. Would only consider future further studies should the patient develop new symptoms.  ______________________________ R. Roetta Sessions, MD Caleen Essex  CC:  Mirna Mires, M.D.  n. eSIGNED:   R. Roetta Sessions at 08/22/2011 09:35 AM  [name], [phone]

## 2011-08-22 NOTE — Discharge Instructions (Signed)
  Colonoscopy Discharge Instructions  Read the instructions outlined below and refer to this sheet in the next few weeks. These discharge instructions provide you with general information on caring for yourself after you leave the hospital. Your doctor may also give you specific instructions. While your treatment has been planned according to the most current medical practices available, unavoidable complications occasionally occur. If you have any problems or questions after discharge, call Dr. Jena Gauss at [phone]. ACTIVITY  You may resume your regular activity, but move at a slower pace for the next 24 hours.   Take frequent rest periods for the next 24 hours.   Walking will help get rid of the air and reduce the bloated feeling in your belly (abdomen).   No driving for 24 hours (because of the medicine (anesthesia) used during the test).    Do not sign any important legal documents or operate any machinery for 24 hours (because of the anesthesia used during the test).  NUTRITION  Drink plenty of fluids.   You may resume your normal diet as instructed by your doctor.   Begin with a light meal and progress to your normal diet. Heavy or fried foods are harder to digest and may make you feel sick to your stomach (nauseated).   Avoid alcoholic beverages for 24 hours or as instructed.  MEDICATIONS  You may resume your normal medications unless your doctor tells you otherwise.  WHAT YOU CAN EXPECT TODAY  Some feelings of bloating in the abdomen.   Passage of more gas than usual.   Spotting of blood in your stool or on the toilet paper.  IF YOU HAD POLYPS REMOVED DURING THE COLONOSCOPY:  No aspirin products for 7 days or as instructed.   No alcohol for 7 days or as instructed.   Eat a soft diet for the next 24 hours.  FINDING OUT THE RESULTS OF YOUR TEST Not all test results are available during your visit. If your test results are not back during the visit, make an appointment  with your caregiver to find out the results. Do not assume everything is normal if you have not heard from your caregiver or the medical facility. It is important for you to follow up on all of your test results.  SEEK IMMEDIATE MEDICAL ATTENTION IF:  You have more than a spotting of blood in your stool.   Your belly is swollen (abdominal distention).   You are nauseated or vomiting.   You have a temperature over 101.   You have abdominal pain or discomfort that is severe or gets worse throughout the day.   Follow up with Dr. Loleta Chance

## 2011-08-23 ENCOUNTER — Encounter (HOSPITAL_COMMUNITY): Payer: Self-pay | Admitting: Internal Medicine

## 2011-11-23 ENCOUNTER — Encounter: Payer: Self-pay | Admitting: *Deleted

## 2011-12-05 ENCOUNTER — Ambulatory Visit (INDEPENDENT_AMBULATORY_CARE_PROVIDER_SITE_OTHER): Payer: Medicare Other | Admitting: *Deleted

## 2011-12-05 DIAGNOSIS — Z950 Presence of cardiac pacemaker: Secondary | ICD-10-CM

## 2011-12-05 DIAGNOSIS — I498 Other specified cardiac arrhythmias: Secondary | ICD-10-CM

## 2011-12-05 LAB — PACEMAKER DEVICE OBSERVATION
AL AMPLITUDE: 1.3 mv
AL IMPEDENCE PM: 330 Ohm
AL THRESHOLD: 0.5 V
BATTERY VOLTAGE: 2.76 V
RV LEAD THRESHOLD: 0.5 V

## 2011-12-05 NOTE — Progress Notes (Signed)
Pacer check in clinic  

## 2011-12-25 ENCOUNTER — Encounter: Payer: Self-pay | Admitting: Internal Medicine

## 2012-05-12 ENCOUNTER — Encounter: Payer: Self-pay | Admitting: Internal Medicine

## 2012-05-12 ENCOUNTER — Ambulatory Visit (INDEPENDENT_AMBULATORY_CARE_PROVIDER_SITE_OTHER): Payer: Medicare Other | Admitting: Internal Medicine

## 2012-05-12 VITALS — BP 136/64 | HR 64 | Ht 63.0 in | Wt 126.0 lb

## 2012-05-12 DIAGNOSIS — Z950 Presence of cardiac pacemaker: Secondary | ICD-10-CM

## 2012-05-12 LAB — PACEMAKER DEVICE OBSERVATION
AL AMPLITUDE: 1.5 mv
AL IMPEDENCE PM: 326 Ohm
ATRIAL PACING PM: 29
BAMS-0001: 150 {beats}/min
RV LEAD THRESHOLD: 0.5 V
VENTRICULAR PACING PM: 2

## 2012-05-12 NOTE — Patient Instructions (Addendum)
Your physician recommends that you schedule a follow-up appointment in: ONE YEAR WITH GT AND 6 MONTHS WITH PAULA D

## 2012-05-12 NOTE — Progress Notes (Signed)
HPI Mrs. Cardy returns today for followup. She is a very pleasant 79 year old woman with a history of symptomatic bradycardia, hypertension,, and diabetes, status post permanent pacemaker insertion. She has done well in the interim. Her only complaints today are that of bronchitis which is in the process of resolving. She denies syncope, chest pain, or shortness of breath. No peripheral edema or palpitations Allergies  Allergen Reactions  . Actos (Pioglitazone Hydrochloride)     Reaction:unknown     Current Outpatient Prescriptions  Medication Sig Dispense Refill  . aspirin 81 MG tablet Take 81 mg by mouth every morning.       Marland Kitchen glipiZIDE (GLUCOTROL) 5 MG tablet Take 5-10 mg by mouth 2 (two) times daily. 1 tablet in the morning and two tablets in the afternoon      . levofloxacin (LEVAQUIN) 500 MG tablet Take 500 mg by mouth daily. Will finish Friday 05-16-2012      . metFORMIN (GLUCOPHAGE) 1000 MG tablet Take 1,000 mg by mouth 2 (two) times daily with a meal.        . metoprolol succinate (TOPROL-XL) 25 MG 24 hr tablet Take 25 mg by mouth every morning.       . Omega-3 Fatty Acids (FISH OIL) 1000 MG CAPS Take 1,000 mg by mouth every evening.       Marland Kitchen oxybutynin (DITROPAN) 5 MG tablet Take 5 mg by mouth 2 (two) times daily.      . simvastatin (ZOCOR) 40 MG tablet Take 40 mg by mouth at bedtime.        . valsartan-hydrochlorothiazide (DIOVAN-HCT) 160-12.5 MG per tablet Take 1 tablet by mouth every morning.        No current facility-administered medications for this visit.     Past Medical History  Diagnosis Date  . Diabetes mellitus   . Hypertension   . CAD (coronary artery disease)   . Other specified cardiac dysrhythmias     ROS:   All systems reviewed and negative except as noted in the HPI.   Past Surgical History  Procedure Laterality Date  . Cardiac catheterization    . Pacemaker insertion  10/30/2007    St. Jude Zephyr dual lead pacemaker  . Colonoscopy  08/22/2011   Procedure: COLONOSCOPY;  Surgeon: Corbin Ade, MD;  Location: [facility];  Service: Endoscopy;  Laterality: N/A;  8:30 AM     History reviewed. No pertinent family history.   History   Social History  . Marital Status: Married    Spouse Name: N/A    Number of Children: N/A  . Years of Education: N/A   Occupational History  . Retired    Social History Main Topics  . Smoking status: Never Smoker   . Smokeless tobacco: Not on file  . Alcohol Use: No  . Drug Use: No  . Sexually Active: Not on file   Other Topics Concern  . Not on file   Social History Narrative   No regular exercise      BP 136/64  Pulse 64  Ht 5\' 3"  (1.6 m)  Wt 126 lb (57.153 kg)  BMI 22.33 kg/m2  Physical Exam:  Well appearing elderly woman,NAD HEENT: Unremarkable Neck:  No JVD, no thyromegally Lungs:  Clear bilaterally with no wheezes, rales, or rhonchi. Well-healed pacemaker incision. HEART:  Regular rate rhythm, no murmurs, no rubs, no clicks Abd:  soft, positive bowel sounds, no organomegally, no rebound, no guarding Ext:  2 plus pulses, no edema, no cyanosis,  no clubbing Skin:  No rashes no nodules Neuro:  CN II through XII intact, motor grossly intact   DEVICE  Normal device function.  See PaceArt for details.   Assess/Plan:

## 2012-05-12 NOTE — Assessment & Plan Note (Signed)
Her blood pressure is well controlled. She will maintain a low-sodium diet. She will continue her current medical therapy.

## 2012-05-12 NOTE — Assessment & Plan Note (Signed)
Her St. Jude Dual-chamber pacemaker is working normally. We'll plan to recheck in several months.

## 2012-05-21 ENCOUNTER — Ambulatory Visit (HOSPITAL_COMMUNITY)
Admission: RE | Admit: 2012-05-21 | Discharge: 2012-05-21 | Disposition: A | Discharge status: Home / Self Care | Payer: Medicare Other | Source: Ambulatory Visit | Attending: Family Medicine | Admitting: Family Medicine

## 2012-05-21 ENCOUNTER — Other Ambulatory Visit (HOSPITAL_COMMUNITY): Payer: Self-pay | Admitting: Family Medicine

## 2012-05-21 DIAGNOSIS — M25519 Pain in unspecified shoulder: Secondary | ICD-10-CM | POA: Insufficient documentation

## 2012-11-19 ENCOUNTER — Ambulatory Visit (INDEPENDENT_AMBULATORY_CARE_PROVIDER_SITE_OTHER): Payer: Medicare Other | Admitting: *Deleted

## 2012-11-19 DIAGNOSIS — I498 Other specified cardiac arrhythmias: Secondary | ICD-10-CM

## 2012-11-19 NOTE — Progress Notes (Signed)
PPM check in office. 

## 2012-12-09 ENCOUNTER — Other Ambulatory Visit: Payer: Self-pay

## 2012-12-10 ENCOUNTER — Encounter: Payer: Self-pay | Admitting: Internal Medicine

## 2013-05-29 ENCOUNTER — Ambulatory Visit (INDEPENDENT_AMBULATORY_CARE_PROVIDER_SITE_OTHER): Payer: Medicare Other | Admitting: Internal Medicine

## 2013-05-29 ENCOUNTER — Encounter: Payer: Self-pay | Admitting: Internal Medicine

## 2013-05-29 VITALS — BP 141/61 | HR 62 | Wt 123.0 lb

## 2013-05-29 DIAGNOSIS — I498 Other specified cardiac arrhythmias: Secondary | ICD-10-CM

## 2013-05-29 DIAGNOSIS — I10 Essential (primary) hypertension: Secondary | ICD-10-CM

## 2013-05-29 DIAGNOSIS — Z950 Presence of cardiac pacemaker: Secondary | ICD-10-CM

## 2013-05-29 LAB — MDC_IDC_ENUM_SESS_TYPE_INCLINIC
Battery Impedance: 1000 Ohm
Brady Statistic RA Percent Paced: 25 %
Date Time Interrogation Session: 20150320100831
Implantable Pulse Generator Serial Number: 1209038
Lead Channel Impedance Value: 342 Ohm
Lead Channel Impedance Value: 436 Ohm
Lead Channel Pacing Threshold Pulse Width: 0.5 ms
Lead Channel Sensing Intrinsic Amplitude: 1.3 mV
Lead Channel Sensing Intrinsic Amplitude: 12 mV
Lead Channel Setting Sensing Sensitivity: 2 mV
MDC IDC MSMT BATTERY VOLTAGE: 2.76 V
MDC IDC MSMT LEADCHNL RA PACING THRESHOLD AMPLITUDE: 0.5 V
MDC IDC MSMT LEADCHNL RV PACING THRESHOLD AMPLITUDE: 0.375 V
MDC IDC MSMT LEADCHNL RV PACING THRESHOLD PULSEWIDTH: 0.5 ms
MDC IDC SET LEADCHNL RA PACING AMPLITUDE: 2 V
MDC IDC SET LEADCHNL RV PACING PULSEWIDTH: 0.5 ms
MDC IDC STAT BRADY RV PERCENT PACED: 15 %

## 2013-05-29 NOTE — Assessment & Plan Note (Signed)
Her St. Jude DDD PM is working normally. Will recheck in several months.  

## 2013-05-29 NOTE — Assessment & Plan Note (Signed)
Her blood pressure is well controlled. She will continue her current meds.  

## 2013-05-29 NOTE — Progress Notes (Signed)
HPI Bridget Rowland returns today for followup. She is a very pleasant 80 year old woman with a history of symptomatic bradycardia, hypertension,, and diabetes, status post permanent pacemaker insertion. She has done well in the interim. Her only complaints today are that she has occaisional dyspnea.  She denies syncope, chest pain, or shortness of breath. No peripheral edema or palpitations. She dances 3 times a week. Allergies  Allergen Reactions  . Actos [Pioglitazone Hydrochloride]     Reaction:unknown     Current Outpatient Prescriptions  Medication Sig Dispense Refill  . amitriptyline (ELAVIL) 25 MG tablet Take 25 mg by mouth at bedtime.      Marland Kitchen aspirin 81 MG tablet Take 81 mg by mouth every morning.       Marland Kitchen glipiZIDE (GLUCOTROL) 5 MG tablet Take 5 mg by mouth 3 (three) times daily.       . metFORMIN (GLUCOPHAGE) 1000 MG tablet Take 1,000 mg by mouth 2 (two) times daily with a meal.        . metoprolol succinate (TOPROL-XL) 25 MG 24 hr tablet Take 25 mg by mouth every morning.       . Omega-3 Fatty Acids (FISH OIL) 1000 MG CAPS Take 1,000 mg by mouth every evening.       Marland Kitchen oxybutynin (DITROPAN) 5 MG tablet Take 5 mg by mouth 2 (two) times daily.      . simvastatin (ZOCOR) 40 MG tablet Take 40 mg by mouth at bedtime.        . valsartan-hydrochlorothiazide (DIOVAN-HCT) 160-12.5 MG per tablet Take 1 tablet by mouth every morning.        No current facility-administered medications for this visit.     Past Medical History  Diagnosis Date  . Diabetes mellitus   . Hypertension   . CAD (coronary artery disease)   . Other specified cardiac dysrhythmias(427.89)     ROS:   All systems reviewed and negative except as noted in the HPI.   Past Surgical History  Procedure Laterality Date  . Cardiac catheterization    . Pacemaker insertion  10/30/2007    St. Jude Zephyr dual lead pacemaker  . Colonoscopy  08/22/2011    Procedure: COLONOSCOPY;  Surgeon: Daneil Dolin, MD;  Location: [facility]  END[facility];  Service: Endoscopy;  Laterality: N/A;  8:30 AM     History reviewed. No pertinent family history.   History   Social History  . Marital Status: Married    Spouse Name: N/A    Number of Children: N/A  . Years of Education: N/A   Occupational History  . Retired    Social History Main Topics  . Smoking status: Never Smoker   . Smokeless tobacco: Not on file  . Alcohol Use: No  . Drug Use: No  . Sexual Activity: Not on file   Other Topics Concern  . Not on file   Social History Narrative   No regular exercise      BP 141/61  Pulse 62  Wt 123 lb (55.792 kg)  Physical Exam:  Well appearing elderly woman,NAD HEENT: Unremarkable Neck:  No JVD, no thyromegally Lungs:  Clear bilaterally with no wheezes, rales, or rhonchi. Well-healed pacemaker incision. HEART:  Regular rate rhythm, no murmurs, no rubs, no clicks Abd:  soft, positive bowel sounds, no organomegally, no rebound, no guarding Ext:  2 plus pulses, no edema, no cyanosis, no clubbing Skin:  No rashes no nodules Neuro:  CN II through XII intact, motor grossly intact  DEVICE  Normal device function.  See PaceArt for details.   Assess/Plan:

## 2013-05-29 NOTE — Patient Instructions (Signed)
Your physician recommends that you schedule a follow-up appointment in: 12 months with Dr Taylor and 6 months with device clinic. You will receive a reminder letter two months in advance reminding you to call and schedule your appointment. If you don't receive this letter, please contact our office. 

## 2013-11-20 ENCOUNTER — Ambulatory Visit (INDEPENDENT_AMBULATORY_CARE_PROVIDER_SITE_OTHER): Payer: Medicare Other | Admitting: *Deleted

## 2013-11-20 DIAGNOSIS — I498 Other specified cardiac arrhythmias: Secondary | ICD-10-CM

## 2013-11-20 LAB — MDC_IDC_ENUM_SESS_TYPE_INCLINIC
Date Time Interrogation Session: 20150911102206
Lead Channel Impedance Value: 313 Ohm
Lead Channel Impedance Value: 424 Ohm
Lead Channel Pacing Threshold Amplitude: 0.375 V
Lead Channel Pacing Threshold Pulse Width: 0.5 ms
Lead Channel Pacing Threshold Pulse Width: 0.5 ms
Lead Channel Sensing Intrinsic Amplitude: 0.7 mV
Lead Channel Setting Pacing Pulse Width: 0.5 ms
MDC IDC MSMT BATTERY IMPEDANCE: 1300 Ohm
MDC IDC MSMT BATTERY VOLTAGE: 2.79 V
MDC IDC MSMT LEADCHNL RA PACING THRESHOLD AMPLITUDE: 0.5 V
MDC IDC MSMT LEADCHNL RV SENSING INTR AMPL: 12 mV
MDC IDC PG SERIAL: 1209038
MDC IDC SET LEADCHNL RA PACING AMPLITUDE: 2 V
MDC IDC SET LEADCHNL RV SENSING SENSITIVITY: 2 mV
MDC IDC STAT BRADY RA PERCENT PACED: 46 %
MDC IDC STAT BRADY RV PERCENT PACED: 30 %

## 2013-11-20 NOTE — Progress Notes (Signed)
PPM check in office. 

## 2013-12-01 ENCOUNTER — Encounter: Payer: Self-pay | Admitting: Internal Medicine

## 2014-06-18 ENCOUNTER — Ambulatory Visit (INDEPENDENT_AMBULATORY_CARE_PROVIDER_SITE_OTHER): Payer: PPO | Admitting: Internal Medicine

## 2014-06-18 ENCOUNTER — Encounter: Payer: Self-pay | Admitting: Internal Medicine

## 2014-06-18 VITALS — BP 134/52 | HR 61 | Ht 62.5 in | Wt 120.2 lb

## 2014-06-18 DIAGNOSIS — I2510 Atherosclerotic heart disease of native coronary artery without angina pectoris: Secondary | ICD-10-CM

## 2014-06-18 DIAGNOSIS — I10 Essential (primary) hypertension: Secondary | ICD-10-CM

## 2014-06-18 DIAGNOSIS — Z950 Presence of cardiac pacemaker: Secondary | ICD-10-CM

## 2014-06-18 DIAGNOSIS — Z136 Encounter for screening for cardiovascular disorders: Secondary | ICD-10-CM

## 2014-06-18 DIAGNOSIS — I495 Sick sinus syndrome: Secondary | ICD-10-CM

## 2014-06-18 LAB — MDC_IDC_ENUM_SESS_TYPE_INCLINIC
Battery Impedance: 1400 Ohm
Implantable Pulse Generator Model: 5826
Lead Channel Pacing Threshold Amplitude: 0.5 V
Lead Channel Pacing Threshold Amplitude: 0.5 V
Lead Channel Pacing Threshold Pulse Width: 0.5 ms
Lead Channel Pacing Threshold Pulse Width: 0.5 ms
Lead Channel Sensing Intrinsic Amplitude: 0.9 mV
Lead Channel Setting Pacing Pulse Width: 0.5 ms
MDC IDC MSMT BATTERY VOLTAGE: 2.76 V
MDC IDC MSMT LEADCHNL RA IMPEDANCE VALUE: 337 Ohm
MDC IDC MSMT LEADCHNL RV IMPEDANCE VALUE: 384 Ohm
MDC IDC PG SERIAL: 1209038
MDC IDC SESS DTM: 20160408103525
MDC IDC SET LEADCHNL RA PACING AMPLITUDE: 2 V
MDC IDC SET LEADCHNL RV SENSING SENSITIVITY: 2 mV
MDC IDC STAT BRADY RA PERCENT PACED: 34 %
MDC IDC STAT BRADY RV PERCENT PACED: 38 %

## 2014-06-18 NOTE — Assessment & Plan Note (Signed)
Her blood pressure is well controlled. Will follow. No change in meds. She will maintain a low sodium diet.

## 2014-06-18 NOTE — Patient Instructions (Signed)
Your physician wants you to follow-up in: 1 year with Dr. Taylor. You will receive a reminder letter in the mail two months in advance. If you don't receive a letter, please call our office to schedule the follow-up appointment.  Your physician recommends that you continue on your current medications as directed. Please refer to the Current Medication list given to you today.  Thank you for choosing [facility]!   

## 2014-06-18 NOTE — Progress Notes (Signed)
HPI Bridget Rowland returns today for followup. She is a very pleasant 81 year old woman with a history of symptomatic bradycardia due to complete heart block, hypertension,, and diabetes, status post permanent pacemaker insertion. She has done well in the interim. She has no complaints today.  She denies syncope, chest pain, or shortness of breath. No peripheral edema or palpitations. She dances 3 times a week. Allergies  Allergen Reactions  . Actos [Pioglitazone Hydrochloride]     Reaction:unknown     Current Outpatient Prescriptions  Medication Sig Dispense Refill  . amitriptyline (ELAVIL) 25 MG tablet Take 25 mg by mouth at bedtime.    Marland Kitchen aspirin 81 MG tablet Take 81 mg by mouth every morning.     Marland Kitchen glipiZIDE (GLUCOTROL) 5 MG tablet Take 5 mg by mouth 3 (three) times daily.     . metFORMIN (GLUCOPHAGE) 1000 MG tablet Take 1,000 mg by mouth 2 (two) times daily with a meal.      . metoprolol succinate (TOPROL-XL) 25 MG 24 hr tablet Take 25 mg by mouth every morning.     . Omega-3 Fatty Acids (FISH OIL) 1000 MG CAPS Take 1,000 mg by mouth every evening.     Marland Kitchen oxybutynin (DITROPAN) 5 MG tablet Take 5 mg by mouth 2 (two) times daily.    . simvastatin (ZOCOR) 40 MG tablet Take 40 mg by mouth at bedtime.      . valsartan-hydrochlorothiazide (DIOVAN-HCT) 160-12.5 MG per tablet Take 1 tablet by mouth every morning.      No current facility-administered medications for this visit.     Past Medical History  Diagnosis Date  . Diabetes mellitus   . Hypertension   . CAD (coronary artery disease)   . Other specified cardiac dysrhythmias(427.89)     ROS:   All systems reviewed and negative except as noted in the HPI.   Past Surgical History  Procedure Laterality Date  . Cardiac catheterization    . Pacemaker insertion  10/30/2007    St. Jude Zephyr dual lead pacemaker  . Colonoscopy  08/22/2011    Procedure: COLONOSCOPY;  Surgeon: Daneil Dolin, MD;  Location: [facility];  Service:  Endoscopy;  Laterality: N/A;  8:30 AM     No family history on file.   History   Social History  . Marital Status: Married    Spouse Name: N/A  . Number of Children: N/A  . Years of Education: N/A   Occupational History  . Retired    Social History Main Topics  . Smoking status: Never Smoker   . Smokeless tobacco: Not on file  . Alcohol Use: No  . Drug Use: No  . Sexual Activity: Not on file   Other Topics Concern  . Not on file   Social History Narrative   No regular exercise      BP 134/52 mmHg  Pulse 61  Ht 5' 2.5" (1.588 m)  Wt 120 lb 4 oz (54.545 kg)  BMI 21.63 kg/m2  Physical Exam:  Well appearing elderly woman,NAD HEENT: Unremarkable Neck:  No JVD, no thyromegally Lungs:  Clear bilaterally with no wheezes, rales, or rhonchi. Well-healed pacemaker incision. HEART:  Regular rate rhythm, no murmurs, no rubs, no clicks Abd:  soft, positive bowel sounds, no organomegally, no rebound, no guarding Ext:  2 plus pulses, no edema, no cyanosis, no clubbing Skin:  No rashes no nodules Neuro:  CN II through XII intact, motor grossly intact   DEVICE  Normal device function.  See PaceArt for details.   Assess/Plan:

## 2014-06-18 NOTE — Addendum Note (Signed)
Addended by: Levonne Hubert on: 06/18/2014 10:37 AM   Modules accepted: Level of Service

## 2014-06-18 NOTE — Assessment & Plan Note (Signed)
She denies anginal symptoms and is quite active. She will continue her current meds.

## 2014-06-18 NOTE — Assessment & Plan Note (Signed)
Her St. Jude DDD PM is working normally. Will recheck in several months.  

## 2014-07-01 ENCOUNTER — Encounter: Payer: Self-pay | Admitting: Internal Medicine

## 2014-11-01 ENCOUNTER — Encounter: Payer: Self-pay | Admitting: Adult Health

## 2014-11-01 ENCOUNTER — Ambulatory Visit (INDEPENDENT_AMBULATORY_CARE_PROVIDER_SITE_OTHER): Payer: PPO | Admitting: Adult Health

## 2014-11-01 VITALS — BP 170/68 | HR 64 | Ht 62.5 in | Wt 120.0 lb

## 2014-11-01 DIAGNOSIS — R58 Hemorrhage, not elsewhere classified: Secondary | ICD-10-CM | POA: Insufficient documentation

## 2014-11-01 HISTORY — DX: Hemorrhage, not elsewhere classified: R58

## 2014-11-01 NOTE — Progress Notes (Signed)
Subjective:     Patient ID: Bridget Rowland, female   DOB: 1925-09-13, 81 y.o.   MRN: [id number]  HPI [name] is a 81 year old white female,widowed, in complaining of dark spot left inner labia, noticed 3 weeks ago, she is still sexually active and dances 3 days a week.Her partner is 19.   Review of Systems Patient denies any headaches, hearing loss, fatigue, blurred vision, shortness of breath, chest pain, abdominal pain, problems with bowel movements, urination, or intercourse. No joint pain or mood swings.See HPI for positives.  Reviewed past medical,surgical, social and family history. Reviewed medications and allergies.     Objective:   Physical Exam BP 170/68 mmHg  Pulse 64  Ht 5' 2.5" (1.588 m)  Wt 120 lb (54.432 kg)  BMI 21.59 kg/m2   Skin warm and dry.Pelvic: external genitalia is normal in appearance except has bruising left inner labia, vagina: is pale with loss of moisture and rugae, urethra is non tender, cervix:smooth and atrophic, uterus: normal size, shape and contour, non tender, no masses felt, adnexa: no masses or tenderness noted. Bladder is non tender and no masses felt.Dr Glo Herring in for co exam.Ok to continue sexual activity.  Assessment:     Ecchymosis left labia    Plan:     Follow up prn

## 2014-11-01 NOTE — Patient Instructions (Signed)
Follow up prn

## 2014-12-20 ENCOUNTER — Ambulatory Visit (INDEPENDENT_AMBULATORY_CARE_PROVIDER_SITE_OTHER): Payer: PPO | Admitting: *Deleted

## 2014-12-20 DIAGNOSIS — I495 Sick sinus syndrome: Secondary | ICD-10-CM

## 2014-12-20 LAB — CUP PACEART INCLINIC DEVICE CHECK
Brady Statistic RA Percent Paced: 40 %
Brady Statistic RV Percent Paced: 99 %
Lead Channel Impedance Value: 473 Ohm
Lead Channel Pacing Threshold Amplitude: 0.375 V
Lead Channel Pacing Threshold Amplitude: 0.5 V
Lead Channel Sensing Intrinsic Amplitude: 0.9 mV
MDC IDC MSMT BATTERY IMPEDANCE: 1500 Ohm
MDC IDC MSMT BATTERY VOLTAGE: 2.76 V
MDC IDC MSMT LEADCHNL RA IMPEDANCE VALUE: 333 Ohm
MDC IDC MSMT LEADCHNL RA PACING THRESHOLD PULSEWIDTH: 0.5 ms
MDC IDC MSMT LEADCHNL RV PACING THRESHOLD PULSEWIDTH: 0.5 ms
MDC IDC SESS DTM: 20161010104900
MDC IDC SET LEADCHNL RA PACING AMPLITUDE: 2 V
MDC IDC SET LEADCHNL RV PACING PULSEWIDTH: 0.5 ms
MDC IDC SET LEADCHNL RV SENSING SENSITIVITY: 2 mV
Pulse Gen Model: 5826
Pulse Gen Serial Number: 1209038

## 2014-12-20 NOTE — Progress Notes (Signed)
Pacemaker check in clinic. Normal device function. Thresholds, sensing, impedances consistent with previous measurements. Device programmed to maximize longevity. (9) mode switches (<1%)---max dur. 1 min 28 sec, Max A 179---no EGMs. No high ventricular rates noted. Device programmed at appropriate safety margins. Histogram distribution appropriate for patient activity level. Device programmed to optimize intrinsic conduction. Estimated longevity 4.75-6.75 years. Patient will follow up with GT/R in 6 months.

## 2014-12-31 ENCOUNTER — Encounter: Payer: Self-pay | Admitting: Internal Medicine

## 2015-03-22 DIAGNOSIS — I10 Essential (primary) hypertension: Secondary | ICD-10-CM | POA: Diagnosis not present

## 2015-03-22 DIAGNOSIS — R944 Abnormal results of kidney function studies: Secondary | ICD-10-CM | POA: Diagnosis not present

## 2015-03-22 DIAGNOSIS — E119 Type 2 diabetes mellitus without complications: Secondary | ICD-10-CM | POA: Diagnosis not present

## 2015-06-22 ENCOUNTER — Encounter: Payer: Self-pay | Admitting: Internal Medicine

## 2015-06-22 ENCOUNTER — Ambulatory Visit (INDEPENDENT_AMBULATORY_CARE_PROVIDER_SITE_OTHER): Payer: PPO | Admitting: Internal Medicine

## 2015-06-22 VITALS — BP 136/76 | HR 72 | Ht 63.0 in | Wt 120.0 lb

## 2015-06-22 DIAGNOSIS — Z950 Presence of cardiac pacemaker: Secondary | ICD-10-CM

## 2015-06-22 DIAGNOSIS — I495 Sick sinus syndrome: Secondary | ICD-10-CM | POA: Diagnosis not present

## 2015-06-22 LAB — CUP PACEART INCLINIC DEVICE CHECK
Battery Remaining Longevity: 54 mo
Battery Voltage: 2.76 V
Brady Statistic RA Percent Paced: 32 %
Brady Statistic RV Percent Paced: 99 %
Date Time Interrogation Session: 20170412111053
Implantable Lead Implant Date: 20090820
Implantable Lead Location: 753860
Lead Channel Impedance Value: 332 Ohm
Lead Channel Pacing Threshold Pulse Width: 0.5 ms
Lead Channel Sensing Intrinsic Amplitude: 0.9 mV
Lead Channel Setting Sensing Sensitivity: 2 mV
MDC IDC LEAD IMPLANT DT: 20090820
MDC IDC LEAD LOCATION: 753859
MDC IDC MSMT LEADCHNL RA PACING THRESHOLD AMPLITUDE: 0.5 V
MDC IDC MSMT LEADCHNL RV IMPEDANCE VALUE: 451 Ohm
MDC IDC MSMT LEADCHNL RV PACING THRESHOLD AMPLITUDE: 0.375 V
MDC IDC MSMT LEADCHNL RV PACING THRESHOLD PULSEWIDTH: 0.5 ms
MDC IDC SET LEADCHNL RA PACING AMPLITUDE: 2 V
MDC IDC SET LEADCHNL RV PACING PULSEWIDTH: 0.5 ms
Pulse Gen Model: 5826
Pulse Gen Serial Number: 1209038

## 2015-06-22 NOTE — Progress Notes (Signed)
HPI Bridget Rowland returns today for followup. She is a very pleasant 82 year old woman with a history of symptomatic bradycardia due to complete heart block, hypertension,, and diabetes, status post permanent pacemaker insertion. She has done well in the interim. She has had some trouble sleeping.  She denies syncope, chest pain, or shortness of breath. No peripheral edema or palpitations. She dances 3 times a week.   Allergies  Allergen Reactions  . Actos [Pioglitazone Hydrochloride]     Reaction:unknown     Current Outpatient Prescriptions  Medication Sig Dispense Refill  . amitriptyline (ELAVIL) 25 MG tablet Take 25 mg by mouth at bedtime.    Marland Kitchen aspirin 81 MG tablet Take 81 mg by mouth every morning.     Marland Kitchen glipiZIDE (GLUCOTROL) 5 MG tablet Take 5 mg by mouth 3 (three) times daily.     . metFORMIN (GLUCOPHAGE) 1000 MG tablet Take 1,000 mg by mouth 2 (two) times daily with a meal.      . metoprolol succinate (TOPROL-XL) 25 MG 24 hr tablet Take 12.5 mg by mouth every morning.     . Omega-3 Fatty Acids (FISH OIL) 1000 MG CAPS Take 1,000 mg by mouth every evening.     Marland Kitchen oxybutynin (DITROPAN) 5 MG tablet Take 5 mg by mouth 2 (two) times daily.    . valsartan-hydrochlorothiazide (DIOVAN-HCT) 160-12.5 MG per tablet Take 1 tablet by mouth every morning.      No current facility-administered medications for this visit.     Past Medical History  Diagnosis Date  . Diabetes mellitus   . Hypertension   . CAD (coronary artery disease)   . Other specified cardiac dysrhythmias(427.89)   . Hyperlipidemia   . Ecchymosis 11/01/2014    Has bruising left inner labia    ROS:   All systems reviewed and negative except as noted in the HPI.   Past Surgical History  Procedure Laterality Date  . Cardiac catheterization    . Pacemaker insertion  10/30/2007    St. Jude Zephyr dual lead pacemaker  . Colonoscopy  08/22/2011    Procedure: COLONOSCOPY;  Surgeon: Daneil Dolin, MD;  [facility];  Service: Endoscopy;  Laterality: N/A;  8:30 AM  . Foot surgery Left   . Back surgery  1995  . Appendectomy    . Tonsillectomy and adenoidectomy    . Eye surgery       Family History  Problem Relation Age of Onset  . Heart disease Father   . Other Sister     stomach problems  . Cancer Daughter     in remission  . Cancer Maternal Grandfather   . COPD Sister   . Congestive Heart Failure Sister      Social History   Social History  . Marital Status: Married    Spouse Name: N/A  . Number of Children: N/A  . Years of Education: N/A   Occupational History  . Retired    Social History Main Topics  . Smoking status: Never Smoker   . Smokeless tobacco: Never Used  . Alcohol Use: 0.0 oz/week    0 Standard drinks or equivalent per week     Comment: once a month  . Drug Use: No  . Sexual Activity: Yes    Birth Control/ Protection: Post-menopausal   Other Topics Concern  . Not on file   Social History Narrative   No regular exercise      BP 136/76 mmHg  Pulse 72  Ht 5\' 3"  (1.6 m)  Wt 120 lb (54.432 kg)  BMI 21.26 kg/m2  SpO2 99%  Physical Exam:  Well appearing elderly woman,NAD HEENT: Unremarkable Neck:  No JVD, no thyromegally Lungs:  Clear bilaterally with no wheezes, rales, or rhonchi. Well-healed pacemaker incision. HEART:  Regular rate rhythm, no murmurs, no rubs, no clicks Abd:  soft, positive bowel sounds, no organomegally, no rebound, no guarding Ext:  2 plus pulses, no edema, no cyanosis, no clubbing Skin:  No rashes no nodules Neuro:  CN II through XII intact, motor grossly intact   DEVICE  Normal device function.  See PaceArt for details.   Assess/Plan: 1. PPM - her St. Jude DDD PM is working normally. Will recheck in several months. 2. HTN - her blood pressure is well controlled. Will follow. She is encouraged to maintain her level of physical activity. 3. CAD - she remains active and has no anginal symptoms. Will follow.  Mikle Bosworth.D.

## 2015-06-22 NOTE — Patient Instructions (Addendum)
Your physician wants you to follow-up in: 1 Year with Dr. Lovena Le. You will receive a reminder letter in the mail two months in advance. If you don't receive a letter, please call our office to schedule the follow-up appointment.  Your physician recommends that you schedule a follow-up appointment in: 6 Months in the [place] physician recommends that you continue on your current medications as directed. Please refer to the Current Medication list given to you today.  If you need a refill on your cardiac medications before your next appointment, please call your pharmacy.  Thank you for choosing [facility]!

## 2015-06-27 DIAGNOSIS — E782 Mixed hyperlipidemia: Secondary | ICD-10-CM | POA: Diagnosis not present

## 2015-06-27 DIAGNOSIS — E119 Type 2 diabetes mellitus without complications: Secondary | ICD-10-CM | POA: Diagnosis not present

## 2015-06-27 DIAGNOSIS — D509 Iron deficiency anemia, unspecified: Secondary | ICD-10-CM | POA: Diagnosis not present

## 2015-06-29 DIAGNOSIS — I10 Essential (primary) hypertension: Secondary | ICD-10-CM | POA: Diagnosis not present

## 2015-06-29 DIAGNOSIS — R070 Pain in throat: Secondary | ICD-10-CM | POA: Diagnosis not present

## 2015-06-29 DIAGNOSIS — E119 Type 2 diabetes mellitus without complications: Secondary | ICD-10-CM | POA: Diagnosis not present

## 2015-06-29 DIAGNOSIS — D509 Iron deficiency anemia, unspecified: Secondary | ICD-10-CM | POA: Diagnosis not present

## 2015-09-29 DIAGNOSIS — Z08 Encounter for follow-up examination after completed treatment for malignant neoplasm: Secondary | ICD-10-CM | POA: Diagnosis not present

## 2015-09-29 DIAGNOSIS — Z85820 Personal history of malignant melanoma of skin: Secondary | ICD-10-CM | POA: Diagnosis not present

## 2015-09-29 DIAGNOSIS — D225 Melanocytic nevi of trunk: Secondary | ICD-10-CM | POA: Diagnosis not present

## 2015-09-29 DIAGNOSIS — L820 Inflamed seborrheic keratosis: Secondary | ICD-10-CM | POA: Diagnosis not present

## 2015-10-04 DIAGNOSIS — M109 Gout, unspecified: Secondary | ICD-10-CM | POA: Diagnosis not present

## 2015-10-12 DIAGNOSIS — E119 Type 2 diabetes mellitus without complications: Secondary | ICD-10-CM | POA: Diagnosis not present

## 2015-10-12 DIAGNOSIS — E782 Mixed hyperlipidemia: Secondary | ICD-10-CM | POA: Diagnosis not present

## 2015-10-12 DIAGNOSIS — D509 Iron deficiency anemia, unspecified: Secondary | ICD-10-CM | POA: Diagnosis not present

## 2015-10-18 DIAGNOSIS — E875 Hyperkalemia: Secondary | ICD-10-CM | POA: Diagnosis not present

## 2015-10-18 DIAGNOSIS — M10071 Idiopathic gout, right ankle and foot: Secondary | ICD-10-CM | POA: Diagnosis not present

## 2015-10-18 DIAGNOSIS — I10 Essential (primary) hypertension: Secondary | ICD-10-CM | POA: Diagnosis not present

## 2015-10-18 DIAGNOSIS — D509 Iron deficiency anemia, unspecified: Secondary | ICD-10-CM | POA: Diagnosis not present

## 2015-10-18 DIAGNOSIS — R944 Abnormal results of kidney function studies: Secondary | ICD-10-CM | POA: Diagnosis not present

## 2015-10-18 DIAGNOSIS — E119 Type 2 diabetes mellitus without complications: Secondary | ICD-10-CM | POA: Diagnosis not present

## 2015-11-01 DIAGNOSIS — M109 Gout, unspecified: Secondary | ICD-10-CM | POA: Diagnosis not present

## 2015-11-01 DIAGNOSIS — R944 Abnormal results of kidney function studies: Secondary | ICD-10-CM | POA: Diagnosis not present

## 2015-11-01 DIAGNOSIS — E875 Hyperkalemia: Secondary | ICD-10-CM | POA: Diagnosis not present

## 2015-11-01 DIAGNOSIS — I10 Essential (primary) hypertension: Secondary | ICD-10-CM | POA: Diagnosis not present

## 2015-11-01 DIAGNOSIS — E119 Type 2 diabetes mellitus without complications: Secondary | ICD-10-CM | POA: Diagnosis not present

## 2015-12-15 ENCOUNTER — Ambulatory Visit (INDEPENDENT_AMBULATORY_CARE_PROVIDER_SITE_OTHER): Payer: PPO | Admitting: *Deleted

## 2015-12-15 DIAGNOSIS — Z950 Presence of cardiac pacemaker: Secondary | ICD-10-CM

## 2015-12-19 LAB — CUP PACEART INCLINIC DEVICE CHECK
Battery Impedance: 2000 Ohm
Battery Voltage: 2.78 V
Date Time Interrogation Session: 20171005151914
Implantable Lead Implant Date: 20090820
Implantable Lead Location: 753859
Lead Channel Impedance Value: 330 Ohm
Lead Channel Pacing Threshold Pulse Width: 0.5 ms
Lead Channel Pacing Threshold Pulse Width: 0.5 ms
Lead Channel Setting Pacing Amplitude: 2 V
Lead Channel Setting Pacing Pulse Width: 0.5 ms
Lead Channel Setting Sensing Sensitivity: 2 mV
MDC IDC LEAD IMPLANT DT: 20090820
MDC IDC LEAD LOCATION: 753860
MDC IDC MSMT LEADCHNL RA PACING THRESHOLD AMPLITUDE: 0.5 V
MDC IDC MSMT LEADCHNL RA SENSING INTR AMPL: 1.4 mV
MDC IDC MSMT LEADCHNL RV IMPEDANCE VALUE: 438 Ohm
MDC IDC MSMT LEADCHNL RV PACING THRESHOLD AMPLITUDE: 0.5 V
MDC IDC PG SERIAL: 1209038
Pulse Gen Model: 5826

## 2016-01-27 DIAGNOSIS — D509 Iron deficiency anemia, unspecified: Secondary | ICD-10-CM | POA: Diagnosis not present

## 2016-01-27 DIAGNOSIS — E119 Type 2 diabetes mellitus without complications: Secondary | ICD-10-CM | POA: Diagnosis not present

## 2016-01-27 DIAGNOSIS — I10 Essential (primary) hypertension: Secondary | ICD-10-CM | POA: Diagnosis not present

## 2016-01-31 DIAGNOSIS — E1122 Type 2 diabetes mellitus with diabetic chronic kidney disease: Secondary | ICD-10-CM | POA: Diagnosis not present

## 2016-01-31 DIAGNOSIS — I10 Essential (primary) hypertension: Secondary | ICD-10-CM | POA: Diagnosis not present

## 2016-01-31 DIAGNOSIS — N182 Chronic kidney disease, stage 2 (mild): Secondary | ICD-10-CM | POA: Diagnosis not present

## 2016-01-31 DIAGNOSIS — Z23 Encounter for immunization: Secondary | ICD-10-CM | POA: Diagnosis not present

## 2016-01-31 DIAGNOSIS — Z0000 Encounter for general adult medical examination without abnormal findings: Secondary | ICD-10-CM | POA: Diagnosis not present

## 2016-01-31 DIAGNOSIS — D509 Iron deficiency anemia, unspecified: Secondary | ICD-10-CM | POA: Diagnosis not present

## 2016-01-31 DIAGNOSIS — N390 Urinary tract infection, site not specified: Secondary | ICD-10-CM | POA: Diagnosis not present

## 2016-01-31 DIAGNOSIS — M109 Gout, unspecified: Secondary | ICD-10-CM | POA: Diagnosis not present

## 2016-02-29 DIAGNOSIS — R197 Diarrhea, unspecified: Secondary | ICD-10-CM | POA: Diagnosis not present

## 2016-04-02 DIAGNOSIS — J060 Acute laryngopharyngitis: Secondary | ICD-10-CM | POA: Diagnosis not present

## 2016-04-02 DIAGNOSIS — R05 Cough: Secondary | ICD-10-CM | POA: Diagnosis not present

## 2016-05-10 DIAGNOSIS — D509 Iron deficiency anemia, unspecified: Secondary | ICD-10-CM | POA: Diagnosis not present

## 2016-05-10 DIAGNOSIS — E782 Mixed hyperlipidemia: Secondary | ICD-10-CM | POA: Diagnosis not present

## 2016-05-10 DIAGNOSIS — E1122 Type 2 diabetes mellitus with diabetic chronic kidney disease: Secondary | ICD-10-CM | POA: Diagnosis not present

## 2016-05-15 DIAGNOSIS — I10 Essential (primary) hypertension: Secondary | ICD-10-CM | POA: Diagnosis not present

## 2016-05-15 DIAGNOSIS — N182 Chronic kidney disease, stage 2 (mild): Secondary | ICD-10-CM | POA: Diagnosis not present

## 2016-05-15 DIAGNOSIS — E1122 Type 2 diabetes mellitus with diabetic chronic kidney disease: Secondary | ICD-10-CM | POA: Diagnosis not present

## 2016-07-09 ENCOUNTER — Ambulatory Visit (INDEPENDENT_AMBULATORY_CARE_PROVIDER_SITE_OTHER): Payer: PPO | Admitting: Internal Medicine

## 2016-07-09 ENCOUNTER — Encounter: Payer: Self-pay | Admitting: Internal Medicine

## 2016-07-09 VITALS — BP 146/68 | HR 60 | Ht 63.0 in | Wt 117.2 lb

## 2016-07-09 DIAGNOSIS — I495 Sick sinus syndrome: Secondary | ICD-10-CM | POA: Diagnosis not present

## 2016-07-09 LAB — CUP PACEART INCLINIC DEVICE CHECK
Battery Voltage: 2.76 V
Date Time Interrogation Session: 20180430101037
Implantable Lead Implant Date: 20090820
Implantable Lead Location: 753860
Lead Channel Impedance Value: 350 Ohm
Lead Channel Pacing Threshold Amplitude: 0.5 V
Lead Channel Pacing Threshold Pulse Width: 0.5 ms
Lead Channel Pacing Threshold Pulse Width: 0.5 ms
Lead Channel Setting Pacing Pulse Width: 0.5 ms
MDC IDC LEAD IMPLANT DT: 20090820
MDC IDC LEAD LOCATION: 753859
MDC IDC MSMT BATTERY IMPEDANCE: 2300 Ohm
MDC IDC MSMT LEADCHNL RA IMPEDANCE VALUE: 300 Ohm
MDC IDC MSMT LEADCHNL RA SENSING INTR AMPL: 1.4 mV
MDC IDC MSMT LEADCHNL RV PACING THRESHOLD AMPLITUDE: 0.5 V
MDC IDC PG IMPLANT DT: 20090820
MDC IDC SET LEADCHNL RA PACING AMPLITUDE: 2 V
MDC IDC SET LEADCHNL RV SENSING SENSITIVITY: 2 mV
Pulse Gen Serial Number: 1209038

## 2016-07-09 NOTE — Patient Instructions (Signed)
Your physician wants you to follow-up in: 1 Year with Dr. Taylor. You will receive a reminder letter in the mail two months in advance. If you don't receive a letter, please call our office to schedule the follow-up appointment.  Your physician recommends that you schedule a follow-up appointment in: 6 Months with the Device Clinic  Your physician recommends that you continue on your current medications as directed. Please refer to the Current Medication list given to you today.  If you need a refill on your cardiac medications before your next appointment, please call your pharmacy.  Thank you for choosing [facility]!    

## 2016-07-09 NOTE — Progress Notes (Signed)
HPI Bridget Rowland returns today for followup. She is a very pleasant 83 year old woman with a history of symptomatic bradycardia due to complete heart block, hypertension,, and diabetes, status post permanent pacemaker insertion. She has done well in the interim. She has had some trouble sleeping and gets full early.   She denies syncope, chest pain, or shortness of breath. No peripheral edema or palpitations. She dances 2-3 times a week.  She has lost only a few pounds. Allergies  Allergen Reactions  . Actos [Pioglitazone Hydrochloride]     Reaction:unknown     Current Outpatient Prescriptions  Medication Sig Dispense Refill  . aspirin 81 MG tablet Take 81 mg by mouth every morning.     Marland Kitchen glipiZIDE (GLUCOTROL) 5 MG tablet Take 5 mg by mouth 3 (three) times daily.     . Melatonin 10 MG CAPS Take by mouth at bedtime.    . metFORMIN (GLUCOPHAGE) 1000 MG tablet Take 1,000 mg by mouth 2 (two) times daily with a meal.      . metoprolol succinate (TOPROL-XL) 25 MG 24 hr tablet Take 12.5 mg by mouth every morning.     . Omega-3 Fatty Acids (FISH OIL) 1000 MG CAPS Take 1,000 mg by mouth every evening.     Marland Kitchen oxybutynin (DITROPAN) 5 MG tablet Take 5 mg by mouth 2 (two) times daily.    . valsartan-hydrochlorothiazide (DIOVAN-HCT) 160-12.5 MG per tablet Take 1 tablet by mouth every morning.      No current facility-administered medications for this visit.      Past Medical History:  Diagnosis Date  . CAD (coronary artery disease)   . Diabetes mellitus   . Ecchymosis 11/01/2014   Has bruising left inner labia  . Hyperlipidemia   . Hypertension   . Other specified cardiac dysrhythmias(427.89)     ROS:   All systems reviewed and negative except as noted in the HPI.   Past Surgical History:  Procedure Laterality Date  . APPENDECTOMY    . BACK SURGERY  1995  . CARDIAC CATHETERIZATION    . COLONOSCOPY  08/22/2011   Procedure: COLONOSCOPY;  Surgeon: Daneil Dolin, MD;  Location: [facility];  Service: Endoscopy;  Laterality: N/A;  8:30 AM  . EYE SURGERY    . foot surgery Left   . PACEMAKER INSERTION  10/30/2007   St. Jude Zephyr dual lead pacemaker  . TONSILLECTOMY AND ADENOIDECTOMY       Family History  Problem Relation Age of Onset  . Heart disease Father   . Other Sister     stomach problems  . Cancer Daughter     in remission  . Cancer Maternal Grandfather   . COPD Sister   . Congestive Heart Failure Sister      Social History   Social History  . Marital status: Married    Spouse name: N/A  . Number of children: N/A  . Years of education: N/A   Occupational History  . Retired Retired   Social History Main Topics  . Smoking status: Never Smoker  . Smokeless tobacco: Never Used  . Alcohol use 0.0 oz/week     Comment: once a month  . Drug use: No  . Sexual activity: Yes    Birth control/ protection: Post-menopausal   Other Topics Concern  . Not on file   Social History Narrative   No regular exercise      BP (!) 146/68   Pulse 60   Ht 5\' 3"  (  1.6 m)   Wt 117 lb 3.2 oz (53.2 kg)   SpO2 92%   BMI 20.76 kg/m   Physical Exam:  Well appearing elderly woman,NAD HEENT: Unremarkable Neck:  No JVD, no thyromegally Lungs:  Clear bilaterally with no wheezes, rales, or rhonchi. Well-healed pacemaker incision. HEART:  Regular rate rhythm, no murmurs, no rubs, no clicks Abd:  soft, positive bowel sounds, no organomegally, no rebound, no guarding Ext:  2 plus pulses, no edema, no cyanosis, no clubbing Skin:  No rashes no nodules Neuro:  CN II through XII intact, motor grossly intact   DEVICE  Normal device function.  See PaceArt for details.   Assess/Plan: 1. PPM - her St. Jude DDD PM is working normally. Will recheck in several months. 2. HTN - her blood pressure is well controlled. Will follow. She is encouraged to maintain her level of physical activity. 3. CAD - she remains active and has no anginal symptoms. Will follow.  Mikle Bosworth.D.

## 2016-07-13 DIAGNOSIS — Z6820 Body mass index (BMI) 20.0-20.9, adult: Secondary | ICD-10-CM | POA: Diagnosis not present

## 2016-07-13 DIAGNOSIS — N390 Urinary tract infection, site not specified: Secondary | ICD-10-CM | POA: Diagnosis not present

## 2016-09-18 DIAGNOSIS — R05 Cough: Secondary | ICD-10-CM | POA: Diagnosis not present

## 2016-12-04 DIAGNOSIS — Z08 Encounter for follow-up examination after completed treatment for malignant neoplasm: Secondary | ICD-10-CM | POA: Diagnosis not present

## 2016-12-04 DIAGNOSIS — Z85820 Personal history of malignant melanoma of skin: Secondary | ICD-10-CM | POA: Diagnosis not present

## 2016-12-04 DIAGNOSIS — Z1283 Encounter for screening for malignant neoplasm of skin: Secondary | ICD-10-CM | POA: Diagnosis not present

## 2016-12-04 DIAGNOSIS — L308 Other specified dermatitis: Secondary | ICD-10-CM | POA: Diagnosis not present

## 2016-12-04 DIAGNOSIS — L708 Other acne: Secondary | ICD-10-CM | POA: Diagnosis not present

## 2017-01-11 ENCOUNTER — Ambulatory Visit (INDEPENDENT_AMBULATORY_CARE_PROVIDER_SITE_OTHER): Payer: PPO | Admitting: *Deleted

## 2017-01-11 DIAGNOSIS — I495 Sick sinus syndrome: Secondary | ICD-10-CM | POA: Diagnosis not present

## 2017-01-11 LAB — CUP PACEART INCLINIC DEVICE CHECK
Battery Impedance: 2700 Ohm
Battery Voltage: 2.75 V
Brady Statistic RV Percent Paced: 99 %
Implantable Lead Implant Date: 20090820
Implantable Lead Location: 753859
Lead Channel Impedance Value: 305 Ohm
Lead Channel Pacing Threshold Amplitude: 0.5 V
Lead Channel Pacing Threshold Pulse Width: 0.5 ms
Lead Channel Pacing Threshold Pulse Width: 0.5 ms
Lead Channel Setting Pacing Amplitude: 2 V
Lead Channel Setting Pacing Pulse Width: 0.5 ms
Lead Channel Setting Sensing Sensitivity: 2 mV
MDC IDC LEAD IMPLANT DT: 20090820
MDC IDC LEAD LOCATION: 753860
MDC IDC MSMT LEADCHNL RV IMPEDANCE VALUE: 395 Ohm
MDC IDC MSMT LEADCHNL RV PACING THRESHOLD AMPLITUDE: 0.5 V
MDC IDC PG IMPLANT DT: 20090820
MDC IDC PG SERIAL: 1209038
MDC IDC SESS DTM: 20181102111502
MDC IDC STAT BRADY RA PERCENT PACED: 28 %

## 2017-01-11 NOTE — Progress Notes (Signed)
Pacemaker check in clinic. Normal device function. Thresholds, sensing, impedances consistent with previous measurements. Device programmed to maximize longevity. <1% AT/AF burden--max dur. 54mins 38sec, no EGMs. Device programmed at appropriate safety margins. Histogram distribution appropriate for patient activity level. Device programmed to optimize intrinsic conduction. Estimated longevity 3-4.50 years. ROV w/ GT 06/2017

## 2017-02-01 DIAGNOSIS — E1122 Type 2 diabetes mellitus with diabetic chronic kidney disease: Secondary | ICD-10-CM | POA: Diagnosis not present

## 2017-02-01 DIAGNOSIS — I10 Essential (primary) hypertension: Secondary | ICD-10-CM | POA: Diagnosis not present

## 2017-02-01 DIAGNOSIS — D509 Iron deficiency anemia, unspecified: Secondary | ICD-10-CM | POA: Diagnosis not present

## 2017-02-05 DIAGNOSIS — N183 Chronic kidney disease, stage 3 (moderate): Secondary | ICD-10-CM | POA: Diagnosis not present

## 2017-02-05 DIAGNOSIS — D509 Iron deficiency anemia, unspecified: Secondary | ICD-10-CM | POA: Diagnosis not present

## 2017-02-05 DIAGNOSIS — I10 Essential (primary) hypertension: Secondary | ICD-10-CM | POA: Diagnosis not present

## 2017-02-05 DIAGNOSIS — E1122 Type 2 diabetes mellitus with diabetic chronic kidney disease: Secondary | ICD-10-CM | POA: Diagnosis not present

## 2017-02-05 DIAGNOSIS — M109 Gout, unspecified: Secondary | ICD-10-CM | POA: Diagnosis not present

## 2017-06-14 DIAGNOSIS — M109 Gout, unspecified: Secondary | ICD-10-CM | POA: Diagnosis not present

## 2017-06-14 DIAGNOSIS — I10 Essential (primary) hypertension: Secondary | ICD-10-CM | POA: Diagnosis not present

## 2017-06-14 DIAGNOSIS — N183 Chronic kidney disease, stage 3 (moderate): Secondary | ICD-10-CM | POA: Diagnosis not present

## 2017-06-14 DIAGNOSIS — D509 Iron deficiency anemia, unspecified: Secondary | ICD-10-CM | POA: Diagnosis not present

## 2017-06-14 DIAGNOSIS — E1122 Type 2 diabetes mellitus with diabetic chronic kidney disease: Secondary | ICD-10-CM | POA: Diagnosis not present

## 2017-06-19 DIAGNOSIS — M109 Gout, unspecified: Secondary | ICD-10-CM | POA: Diagnosis not present

## 2017-06-19 DIAGNOSIS — D509 Iron deficiency anemia, unspecified: Secondary | ICD-10-CM | POA: Diagnosis not present

## 2017-06-19 DIAGNOSIS — E1122 Type 2 diabetes mellitus with diabetic chronic kidney disease: Secondary | ICD-10-CM | POA: Diagnosis not present

## 2017-06-19 DIAGNOSIS — N183 Chronic kidney disease, stage 3 (moderate): Secondary | ICD-10-CM | POA: Diagnosis not present

## 2017-06-19 DIAGNOSIS — I10 Essential (primary) hypertension: Secondary | ICD-10-CM | POA: Diagnosis not present

## 2017-07-11 ENCOUNTER — Encounter: Payer: PPO | Admitting: Internal Medicine

## 2017-09-05 ENCOUNTER — Encounter: Payer: Self-pay | Admitting: Internal Medicine

## 2017-09-05 ENCOUNTER — Ambulatory Visit: Payer: PPO | Admitting: Internal Medicine

## 2017-09-05 ENCOUNTER — Encounter: Payer: PPO | Admitting: Internal Medicine

## 2017-09-05 VITALS — BP 112/70 | HR 64 | Ht 63.0 in | Wt 110.0 lb

## 2017-09-05 DIAGNOSIS — Z950 Presence of cardiac pacemaker: Secondary | ICD-10-CM

## 2017-09-05 DIAGNOSIS — I442 Atrioventricular block, complete: Secondary | ICD-10-CM

## 2017-09-05 LAB — CUP PACEART INCLINIC DEVICE CHECK
Battery Impedance: 3300 Ohm
Battery Voltage: 2.74 V
Date Time Interrogation Session: 20190627161103
Implantable Lead Implant Date: 20090820
Implantable Lead Location: 753860
Implantable Pulse Generator Implant Date: 20090820
Lead Channel Impedance Value: 350 Ohm
Lead Channel Pacing Threshold Amplitude: 0.5 V
Lead Channel Pacing Threshold Pulse Width: 0.5 ms
Lead Channel Pacing Threshold Pulse Width: 0.5 ms
Lead Channel Setting Pacing Pulse Width: 0.5 ms
MDC IDC LEAD IMPLANT DT: 20090820
MDC IDC LEAD LOCATION: 753859
MDC IDC MSMT LEADCHNL RA PACING THRESHOLD AMPLITUDE: 0.75 V
MDC IDC MSMT LEADCHNL RA SENSING INTR AMPL: 0.4 mV
MDC IDC MSMT LEADCHNL RV IMPEDANCE VALUE: 400 Ohm
MDC IDC PG SERIAL: 1209038
MDC IDC SET LEADCHNL RA PACING AMPLITUDE: 2 V
MDC IDC SET LEADCHNL RV SENSING SENSITIVITY: 2 mV
Pulse Gen Model: 5826

## 2017-09-05 NOTE — Patient Instructions (Signed)
Medication Instructions:  Your physician recommends that you continue on your current medications as directed. Please refer to the Current Medication list given to you today.   Labwork: NONE   Testing/Procedures: None   Follow-Up: Your physician wants you to follow-up in: 1 Year with Dr. Lovena Le. You will receive a reminder letter in the mail two months in advance. If you don't receive a letter, please call our office to schedule the follow-up appointment.  Your physician recommends that you schedule a follow-up appointment in: 6 Months with the Device Clinic    Any Other Special Instructions Will Be Listed Below (If Applicable).     If you need a refill on your cardiac medications before your next appointment, please call your pharmacy.  Thank you for choosing [facility]!

## 2017-09-05 NOTE — Progress Notes (Signed)
HPI Bridget Rowland returns today for followup. She is a very pleasant 84 year old woman with a history of symptomatic bradycardia due to complete heart block, hypertension,, and diabetes, status post permanent pacemaker insertion. She has done well in the interim. She has rare days where she feels a little sob.   She denies syncope or chest pain. No peripheral edema or palpitations. She still dances 2-3 times a week.  She has lost only a few pounds. Her appetite is not as good.  Allergies  Allergen Reactions  . Actos [Pioglitazone Hydrochloride]     Reaction:unknown     Current Outpatient Medications  Medication Sig Dispense Refill  . aspirin 81 MG tablet Take 81 mg by mouth every morning.     Marland Kitchen glipiZIDE (GLUCOTROL) 5 MG tablet Take 5 mg by mouth 3 (three) times daily.     . Melatonin 10 MG CAPS Take by mouth at bedtime.    . metFORMIN (GLUCOPHAGE) 1000 MG tablet Take 500 mg by mouth 2 (two) times daily with a meal.     . metoprolol succinate (TOPROL-XL) 25 MG 24 hr tablet Take 12.5 mg by mouth every morning.     . Omega-3 Fatty Acids (FISH OIL) 1000 MG CAPS Take 1,000 mg by mouth every evening.     Marland Kitchen oxybutynin (DITROPAN) 5 MG tablet Take 5 mg by mouth daily.     . valsartan-hydrochlorothiazide (DIOVAN-HCT) 160-12.5 MG per tablet Take 1 tablet by mouth every morning.      No current facility-administered medications for this visit.      Past Medical History:  Diagnosis Date  . CAD (coronary artery disease)   . Diabetes mellitus   . Ecchymosis 11/01/2014   Has bruising left inner labia  . Hyperlipidemia   . Hypertension   . Other specified cardiac dysrhythmias(427.89)     ROS:   All systems reviewed and negative except as noted in the HPI.   Past Surgical History:  Procedure Laterality Date  . APPENDECTOMY    . BACK SURGERY  1995  . CARDIAC CATHETERIZATION    . COLONOSCOPY  08/22/2011   Procedure: COLONOSCOPY;  Surgeon: Daneil Dolin, MD;  Location: [facility];  Service: Endoscopy;  Laterality: N/A;  8:30 AM  . EYE SURGERY    . foot surgery Left   . PACEMAKER INSERTION  10/30/2007   St. Jude Zephyr dual lead pacemaker  . TONSILLECTOMY AND ADENOIDECTOMY       Family History  Problem Relation Age of Onset  . Heart disease Father   . Other Sister        stomach problems  . Cancer Daughter        in remission  . Cancer Maternal Grandfather   . COPD Sister   . Congestive Heart Failure Sister      Social History   Socioeconomic History  . Marital status: Married    Spouse name: Not on file  . Number of children: Not on file  . Years of education: Not on file  . Highest education level: Not on file  Occupational History  . Occupation: Retired    Fish farm manager: RETIRED  Social Needs  . Financial resource strain: Not on file  . Food insecurity:    Worry: Not on file    Inability: Not on file  . Transportation needs:    Medical: Not on file    Non-medical: Not on file  Tobacco Use  . Smoking status: Never Smoker  .  Smokeless tobacco: Never Used  Substance and Sexual Activity  . Alcohol use: Yes    Alcohol/week: 0.0 oz    Comment: once a month  . Drug use: No  . Sexual activity: Yes    Birth control/protection: Post-menopausal  Lifestyle  . Physical activity:    Days per week: Not on file    Minutes per session: Not on file  . Stress: Not on file  Relationships  . Social connections:    Talks on phone: Not on file    Gets together: Not on file    Attends religious service: Not on file    Active member of club or organization: Not on file    Attends meetings of clubs or organizations: Not on file    Relationship status: Not on file  . Intimate partner violence:    Fear of current or ex partner: Not on file    Emotionally abused: Not on file    Physically abused: Not on file    Forced sexual activity: Not on file  Other Topics Concern  . Not on file  Social History Narrative   No regular exercise      BP  112/70 (BP Location: Left Arm)   Pulse 64   Ht 5\' 3"  (1.6 m)   Wt 110 lb (49.9 kg)   SpO2 99%   BMI 19.49 kg/m   Physical Exam:  Well appearing elderly woman, NAD HEENT: Unremarkable Neck:  6 cm JVD, no thyromegally Lymphatics:  No adenopathy Back:  No CVA tenderness Lungs:  Clear with no wheezes HEART:  Regular rate rhythm, no murmurs, no rubs, no clicks Abd:  soft, positive bowel sounds, no organomegally, no rebound, no guarding Ext:  2 plus pulses, no edema, no cyanosis, no clubbing Skin:  No rashes no nodules Neuro:  CN II through XII intact, motor grossly intact  EKG - NSR with ventricular pacing  DEVICE  Normal device function.  See PaceArt for details.   Assess/Plan: 1. CHB - she is doing well, s/p PPM insertion. 2. PPM - her St. Jude DDD PM is working normally. Will recheck in several months. 3. CAD - she denies anginal symptoms. The occaisional periods of sob are not associated with any chest discomfort.  Mikle Bosworth.D

## 2017-10-15 DIAGNOSIS — N183 Chronic kidney disease, stage 3 (moderate): Secondary | ICD-10-CM | POA: Diagnosis not present

## 2017-10-15 DIAGNOSIS — R7309 Other abnormal glucose: Secondary | ICD-10-CM | POA: Diagnosis not present

## 2017-10-15 DIAGNOSIS — D509 Iron deficiency anemia, unspecified: Secondary | ICD-10-CM | POA: Diagnosis not present

## 2017-10-15 DIAGNOSIS — I10 Essential (primary) hypertension: Secondary | ICD-10-CM | POA: Diagnosis not present

## 2017-10-15 DIAGNOSIS — E1122 Type 2 diabetes mellitus with diabetic chronic kidney disease: Secondary | ICD-10-CM | POA: Diagnosis not present

## 2017-10-18 DIAGNOSIS — E1122 Type 2 diabetes mellitus with diabetic chronic kidney disease: Secondary | ICD-10-CM | POA: Diagnosis not present

## 2017-10-18 DIAGNOSIS — E782 Mixed hyperlipidemia: Secondary | ICD-10-CM | POA: Diagnosis not present

## 2017-10-18 DIAGNOSIS — M109 Gout, unspecified: Secondary | ICD-10-CM | POA: Diagnosis not present

## 2017-10-18 DIAGNOSIS — I10 Essential (primary) hypertension: Secondary | ICD-10-CM | POA: Diagnosis not present

## 2017-10-18 DIAGNOSIS — N183 Chronic kidney disease, stage 3 (moderate): Secondary | ICD-10-CM | POA: Diagnosis not present

## 2017-10-18 DIAGNOSIS — D509 Iron deficiency anemia, unspecified: Secondary | ICD-10-CM | POA: Diagnosis not present

## 2017-10-18 DIAGNOSIS — R945 Abnormal results of liver function studies: Secondary | ICD-10-CM | POA: Diagnosis not present

## 2017-12-02 DIAGNOSIS — I10 Essential (primary) hypertension: Secondary | ICD-10-CM | POA: Diagnosis not present

## 2017-12-02 DIAGNOSIS — E782 Mixed hyperlipidemia: Secondary | ICD-10-CM | POA: Diagnosis not present

## 2017-12-02 DIAGNOSIS — R638 Other symptoms and signs concerning food and fluid intake: Secondary | ICD-10-CM | POA: Diagnosis not present

## 2017-12-02 DIAGNOSIS — E1122 Type 2 diabetes mellitus with diabetic chronic kidney disease: Secondary | ICD-10-CM | POA: Diagnosis not present

## 2017-12-02 DIAGNOSIS — M109 Gout, unspecified: Secondary | ICD-10-CM | POA: Diagnosis not present

## 2017-12-02 DIAGNOSIS — R5383 Other fatigue: Secondary | ICD-10-CM | POA: Diagnosis not present

## 2017-12-02 DIAGNOSIS — D509 Iron deficiency anemia, unspecified: Secondary | ICD-10-CM | POA: Diagnosis not present

## 2017-12-02 DIAGNOSIS — N390 Urinary tract infection, site not specified: Secondary | ICD-10-CM | POA: Diagnosis not present

## 2017-12-02 DIAGNOSIS — N183 Chronic kidney disease, stage 3 (moderate): Secondary | ICD-10-CM | POA: Diagnosis not present

## 2017-12-02 DIAGNOSIS — Z6823 Body mass index (BMI) 23.0-23.9, adult: Secondary | ICD-10-CM | POA: Diagnosis not present

## 2017-12-02 DIAGNOSIS — R945 Abnormal results of liver function studies: Secondary | ICD-10-CM | POA: Diagnosis not present

## 2017-12-10 DIAGNOSIS — Z7982 Long term (current) use of aspirin: Secondary | ICD-10-CM | POA: Diagnosis not present

## 2017-12-10 DIAGNOSIS — I129 Hypertensive chronic kidney disease with stage 1 through stage 4 chronic kidney disease, or unspecified chronic kidney disease: Secondary | ICD-10-CM | POA: Diagnosis not present

## 2017-12-10 DIAGNOSIS — E782 Mixed hyperlipidemia: Secondary | ICD-10-CM | POA: Diagnosis not present

## 2017-12-10 DIAGNOSIS — Z7984 Long term (current) use of oral hypoglycemic drugs: Secondary | ICD-10-CM | POA: Diagnosis not present

## 2017-12-10 DIAGNOSIS — E118 Type 2 diabetes mellitus with unspecified complications: Secondary | ICD-10-CM | POA: Diagnosis not present

## 2017-12-10 DIAGNOSIS — N390 Urinary tract infection, site not specified: Secondary | ICD-10-CM | POA: Diagnosis not present

## 2017-12-10 DIAGNOSIS — D509 Iron deficiency anemia, unspecified: Secondary | ICD-10-CM | POA: Diagnosis not present

## 2017-12-10 DIAGNOSIS — M109 Gout, unspecified: Secondary | ICD-10-CM | POA: Diagnosis not present

## 2017-12-10 DIAGNOSIS — E1122 Type 2 diabetes mellitus with diabetic chronic kidney disease: Secondary | ICD-10-CM | POA: Diagnosis not present

## 2017-12-10 DIAGNOSIS — N183 Chronic kidney disease, stage 3 (moderate): Secondary | ICD-10-CM | POA: Diagnosis not present

## 2017-12-11 DIAGNOSIS — N390 Urinary tract infection, site not specified: Secondary | ICD-10-CM | POA: Diagnosis not present

## 2017-12-11 DIAGNOSIS — E119 Type 2 diabetes mellitus without complications: Secondary | ICD-10-CM | POA: Diagnosis not present

## 2017-12-11 DIAGNOSIS — Z681 Body mass index (BMI) 19 or less, adult: Secondary | ICD-10-CM | POA: Diagnosis not present

## 2018-01-14 DIAGNOSIS — I10 Essential (primary) hypertension: Secondary | ICD-10-CM | POA: Diagnosis not present

## 2018-01-14 DIAGNOSIS — N390 Urinary tract infection, site not specified: Secondary | ICD-10-CM | POA: Diagnosis not present

## 2018-01-14 DIAGNOSIS — E118 Type 2 diabetes mellitus with unspecified complications: Secondary | ICD-10-CM | POA: Diagnosis not present

## 2018-01-14 DIAGNOSIS — R0602 Shortness of breath: Secondary | ICD-10-CM | POA: Diagnosis not present

## 2018-01-14 DIAGNOSIS — E1101 Type 2 diabetes mellitus with hyperosmolarity with coma: Secondary | ICD-10-CM | POA: Diagnosis not present

## 2018-01-22 DIAGNOSIS — R799 Abnormal finding of blood chemistry, unspecified: Secondary | ICD-10-CM | POA: Diagnosis not present

## 2018-02-12 ENCOUNTER — Other Ambulatory Visit (HOSPITAL_COMMUNITY): Payer: Self-pay | Admitting: Family Medicine

## 2018-02-12 ENCOUNTER — Telehealth: Payer: Self-pay | Admitting: Internal Medicine

## 2018-02-12 DIAGNOSIS — R1310 Dysphagia, unspecified: Secondary | ICD-10-CM

## 2018-02-12 NOTE — Telephone Encounter (Signed)
Opened in error

## 2018-02-18 ENCOUNTER — Ambulatory Visit (HOSPITAL_COMMUNITY)
Admission: RE | Admit: 2018-02-18 | Discharge: 2018-02-18 | Disposition: A | Discharge status: Home / Self Care | Payer: PPO | Source: Ambulatory Visit | Attending: Family Medicine | Admitting: Family Medicine

## 2018-02-18 DIAGNOSIS — R1310 Dysphagia, unspecified: Secondary | ICD-10-CM | POA: Diagnosis not present

## 2018-02-18 DIAGNOSIS — T17308A Unspecified foreign body in larynx causing other injury, initial encounter: Secondary | ICD-10-CM | POA: Diagnosis not present

## 2018-02-19 DIAGNOSIS — R0602 Shortness of breath: Secondary | ICD-10-CM | POA: Diagnosis not present

## 2018-02-19 DIAGNOSIS — R1310 Dysphagia, unspecified: Secondary | ICD-10-CM | POA: Diagnosis not present

## 2018-02-19 DIAGNOSIS — R634 Abnormal weight loss: Secondary | ICD-10-CM | POA: Diagnosis not present

## 2018-02-19 DIAGNOSIS — M1990 Unspecified osteoarthritis, unspecified site: Secondary | ICD-10-CM | POA: Diagnosis not present

## 2018-02-19 DIAGNOSIS — Z9181 History of falling: Secondary | ICD-10-CM | POA: Diagnosis not present

## 2018-02-19 DIAGNOSIS — R531 Weakness: Secondary | ICD-10-CM | POA: Diagnosis not present

## 2018-02-19 DIAGNOSIS — E440 Moderate protein-calorie malnutrition: Secondary | ICD-10-CM | POA: Diagnosis not present

## 2018-02-19 DIAGNOSIS — I442 Atrioventricular block, complete: Secondary | ICD-10-CM | POA: Diagnosis not present

## 2018-02-19 NOTE — Progress Notes (Addendum)
Cardiology Office Note    Date:  02/24/2018   ID:  Bridget Rowland, DOB May 28, 1925, MRN [id number]  PCP:  Celene Squibb, MD  Cardiologist: No primary care provider on file. EPS: Bridget Peru, MD  Chief Complaint  Patient presents with  . Fatigue    History of Present Illness:  Bridget Rowland is a 84 y.o. female with history of symptomatic bradycardia/complete heart block status post St. Jude permanent pacemaker insertion followed by Dr. Lovena Le, hypertension and diabetes.  Recently saw Dr. Lovena Le 09/05/2017 and was doing well still dancing 2-3 times a week..  Pacemaker was working normally that day.  Complains of 3-4 weeks of progressive dyspnea on exertion. Son is here and think it's been going on for 3-4 months but has gotten much worse in the past month.  Sometimes feels her heart racing when she walks from one room to the other.  Had to stop dancing 3 weeks ago.  Also fatigue. Had labs by Dr. Berdine Addison in the past month that was "ok". Lives alone but has daughter who stays with her most days and nights. Has also had some falls.-4 for total last month.  Had a swallowing study and has mild aspiration and trouble with large bites.  Has lost 18 pounds since she was here last.  No appetite.  Sometimes gets dizzy when she stands up.   Past Medical History:  Diagnosis Date  . CAD (coronary artery disease)   . Diabetes mellitus   . Ecchymosis 11/01/2014   Has bruising left inner labia  . Hyperlipidemia   . Hypertension   . Other specified cardiac dysrhythmias(427.89)     Past Surgical History:  Procedure Laterality Date  . APPENDECTOMY    . BACK SURGERY  1995  . CARDIAC CATHETERIZATION    . COLONOSCOPY  08/22/2011   Procedure: COLONOSCOPY;  Surgeon: Bridget Dolin, MD;  Location: [facility];  Service: Endoscopy;  Laterality: N/A;  8:30 AM  . EYE SURGERY    . foot surgery Left   . PACEMAKER INSERTION  10/30/2007   St. Jude Zephyr dual lead pacemaker  . TONSILLECTOMY AND ADENOIDECTOMY        Current Medications: Current Meds  Medication Sig  . aspirin 81 MG tablet Take 81 mg by mouth every morning.   Marland Kitchen glipiZIDE (GLUCOTROL) 5 MG tablet Take 5 mg by mouth 3 (three) times daily.   . Melatonin 10 MG CAPS Take by mouth at bedtime.  . metFORMIN (GLUCOPHAGE) 1000 MG tablet Take 500 mg by mouth 2 (two) times daily with a meal.   . oxybutynin (DITROPAN) 5 MG tablet Take 5 mg by mouth daily.   . [DISCONTINUED] metoprolol succinate (TOPROL-XL) 25 MG 24 hr tablet Take 12.5 mg by mouth every morning.   . [DISCONTINUED] valsartan-hydrochlorothiazide (DIOVAN-HCT) 160-12.5 MG per tablet Take 1 tablet by mouth every morning.      Allergies:   Actos [pioglitazone hydrochloride]   Social History   Socioeconomic History  . Marital status: Married    Spouse name: Not on file  . Number of children: Not on file  . Years of education: Not on file  . Highest education level: Not on file  Occupational History  . Occupation: Retired    Fish farm manager: RETIRED  Social Needs  . Financial resource strain: Not on file  . Food insecurity:    Worry: Not on file    Inability: Not on file  . Transportation needs:    Medical: Not  on file    Non-medical: Not on file  Tobacco Use  . Smoking status: Never Smoker  . Smokeless tobacco: Never Used  Substance and Sexual Activity  . Alcohol use: Yes    Alcohol/week: 0.0 standard drinks    Comment: once a month  . Drug use: No  . Sexual activity: Yes    Birth control/protection: Post-menopausal  Lifestyle  . Physical activity:    Days per week: Not on file    Minutes per session: Not on file  . Stress: Not on file  Relationships  . Social connections:    Talks on phone: Not on file    Gets together: Not on file    Attends religious service: Not on file    Active member of club or organization: Not on file    Attends meetings of clubs or organizations: Not on file    Relationship status: Not on file  Other Topics Concern  . Not on file   Social History Narrative   No regular exercise      Family History:  The patient's family history includes COPD in her sister; Cancer in her daughter and maternal grandfather; Congestive Heart Failure in her sister; Heart disease in her father; Other in her sister.   ROS:   Please see the history of present illness.    Review of Systems  Constitution: Positive for decreased appetite, malaise/fatigue and weight loss.  HENT: Negative.   Eyes: Negative.   Cardiovascular: Positive for dyspnea on exertion and palpitations.  Respiratory: Negative.   Hematologic/Lymphatic: Negative.   Musculoskeletal: Positive for falls. Negative for joint pain.  Gastrointestinal: Positive for anorexia and dysphagia.  Genitourinary: Negative.   Neurological: Positive for weakness.   All other systems reviewed and are negative.   PHYSICAL EXAM:   VS:  BP 130/82   Pulse 97   Ht 5\' 2"  (1.575 m)   Wt 92 lb 3.2 oz (41.8 kg)   BMI 16.86 kg/m   Physical Exam  GEN: Thin, looks younger than stated age, in no acute distress  Neck: no JVD, carotid bruits, or masses Cardiac: Irregular rate and rhythm 1/6 systolic murmur the left sternal border  respiratory:  clear to auscultation bilaterally, normal work of breathing GI: soft, nontender, nondistended, + BS Ext: without cyanosis, clubbing, or edema, Good distal pulses bilaterally Neuro:  Alert and Oriented x 3,  Psych: euthymic mood, full affect  Wt Readings from Last 3 Encounters:  02/24/18 92 lb 3.2 oz (41.8 kg)  09/05/17 110 lb (49.9 kg)  07/09/16 117 lb 3.2 oz (53.2 kg)      Studies/Labs Reviewed:   EKG:  EKG is ordered today.  The ekg ordered today demonstrates paced rhythm but heart rate 122 bpm  Recent Labs: No results found for requested labs within last 8760 hours.   Lipid Panel No results found for: CHOL, TRIG, HDL, CHOLHDL, VLDL, LDLCALC, LDLDIRECT  Additional studies/ records that were reviewed today include:  IMPRESSION: Laryngeal  penetration to the level of the vocal cords without witnessed aspiration.   However patient clinically aspirated a large volume of water while attempting to swallow the 12.5 mm diameter barium tablet, followed by a weak spontaneous cough and difficulty breathing.   Patient was unable to swallow the 12.5 mm diameter barium tablet.   Moderate age-related esophageal dysmotility.   No gross evidence of esophageal mass or stricture.     Electronically Signed   By: Lavonia Dana M.D.   On: 02/18/2018 11:17  ASSESSMENT:    1. PPM-St.Jude   2. Essential hypertension   3. Dyspnea on exertion   4. Weight loss, unintentional      PLAN:  In order of problems listed above:  [place] pacemaker for complete heart block functioning normally 08/2017 now with fast heart rates at 120 bpm significant dyspnea on exertion.  Suspect underlying atrial fibrillation.  Have arranged for her to have her pacemaker interrogated tomorrow in [place].  CHA2DS2-VASc equals 5 for age, female, hypertension and diabetes.  Patient is 84 years old and weighs 92 pounds.  She has had multiple falls in the past month.  If she is placed on anticoagulation she would need lower dose Eliquis but will leave this decision to Dr. Lovena Le after device is interrogated.  At this time we will increase Toprol to 25 mg daily.  Decrease valsartan HCTZ to 80/12.5 mg daily.  Obtain recent labs from Dr. Berdine Addison.  Essential hypertension blood pressure stable today.  Decrease in valsartan with increase in Toprol.  Weight loss of 18 pounds since June.  Loss of appetite and recent swallow study showing some dysphasia and slight aspiration according to her son.  Addendum labs received from PCP dated 01/22/2018 creatinine was 1.49 potassium was high at 5.7 glucose high at 252.  Will order repeat for tomorrow.  Medication Adjustments/Labs and Tests Ordered: Current medicines are reviewed at length with the patient today.  Concerns  regarding medicines are outlined above.  Medication changes, Labs and Tests ordered today are listed in the Patient Instructions below. Patient Instructions  Medication Instructions:  Your physician has recommended you make the following change in your medication:  Increase Toprol to 25 mg Daily  Decrease Diovan-HCT to 80-12.5 mg daily   If you need a refill on your cardiac medications before your next appointment, please call your pharmacy.   Lab work: NONE  If you have labs (blood work) drawn today and your tests are completely normal, you will receive your results only by: Marland Kitchen MyChart Message (if you have MyChart) OR . A paper copy in the mail If you have any lab test that is abnormal or we need to change your treatment, we will call you to review the results.  Testing/Procedures: NONE   Follow-Up: At [facility], you and your health needs are our priority.  As part of our continuing mission to provide you with exceptional heart care, we have created designated Provider Care Teams.  These Care Teams include your primary Cardiologist (physician) and Advanced Practice Providers (APPs -  Physician Assistants and Nurse Practitioners) who all work together to provide you with the care you need, when you need it. You will need a follow up appointment as planned. Please call our office 2 months in advance to schedule this appointment.  You may see No primary care provider on file. or one of the following Advanced Practice Providers on your designated Care Team:   [place], PA-C [facility]) . Ermalinda Barrios, PA-C ([place])  Any Other Special Instructions Will Be Listed Below (If Applicable). Thank you for choosing [facility]!        Signed, Ermalinda Barrios, PA-C  02/24/2018 1:19 PM    [facility] [place], [place], [place]  [address] Phone: [phone]; Fax: [phone]

## 2018-02-24 ENCOUNTER — Encounter: Payer: Self-pay | Admitting: Physician Assistant

## 2018-02-24 ENCOUNTER — Ambulatory Visit: Payer: PPO | Admitting: Physician Assistant

## 2018-02-24 ENCOUNTER — Encounter: Payer: Self-pay | Admitting: *Deleted

## 2018-02-24 VITALS — BP 130/82 | HR 97 | Ht 62.0 in | Wt 92.2 lb

## 2018-02-24 DIAGNOSIS — Z950 Presence of cardiac pacemaker: Secondary | ICD-10-CM

## 2018-02-24 DIAGNOSIS — E875 Hyperkalemia: Secondary | ICD-10-CM | POA: Diagnosis not present

## 2018-02-24 DIAGNOSIS — R0609 Other forms of dyspnea: Secondary | ICD-10-CM

## 2018-02-24 DIAGNOSIS — R634 Abnormal weight loss: Secondary | ICD-10-CM | POA: Insufficient documentation

## 2018-02-24 DIAGNOSIS — I10 Essential (primary) hypertension: Secondary | ICD-10-CM

## 2018-02-24 MED ORDER — VALSARTAN-HYDROCHLOROTHIAZIDE 80-12.5 MG PO TABS: 1.0000 | ORAL_TABLET | Freq: Every day | ORAL | 3 refills | Status: DC

## 2018-02-24 MED ORDER — METOPROLOL SUCCINATE ER 25 MG PO TB24: 25.0000 mg | ORAL_TABLET | Freq: Every day | ORAL | 3 refills | Status: AC

## 2018-02-24 NOTE — Patient Instructions (Addendum)
Medication Instructions:  Your physician has recommended you make the following change in your medication:  Increase Toprol to 25 mg Daily  Decrease Diovan-HCT to 80-12.5 mg daily   If you need a refill on your cardiac medications before your next appointment, please call your pharmacy.   Lab work: NONE  If you have labs (blood work) drawn today and your tests are completely normal, you will receive your results only by: Marland Kitchen MyChart Message (if you have MyChart) OR . A paper copy in the mail If you have any lab test that is abnormal or we need to change your treatment, we will call you to review the results.  Testing/Procedures: NONE   Follow-Up: At [facility], you and your health needs are our priority.  As part of our continuing mission to provide you with exceptional heart care, we have created designated Provider Care Teams.  These Care Teams include your primary Cardiologist (physician) and Advanced Practice Providers (APPs -  Physician Assistants and Nurse Practitioners) who all work together to provide you with the care you need, when you need it. You will need a follow up appointment as planned. Please call our office 2 months in advance to schedule this appointment.  You may see No primary care provider on file. or one of the following Advanced Practice Providers on your designated Care Team:   [place], PA-C [facility]) . Ermalinda Barrios, PA-C ([place])  Any Other Special Instructions Will Be Listed Below (If Applicable). Thank you for choosing [facility]!

## 2018-02-24 NOTE — Addendum Note (Signed)
Addended by: Levonne Hubert on: 02/24/2018 05:17 PM   Modules accepted: Orders

## 2018-02-25 ENCOUNTER — Ambulatory Visit (INDEPENDENT_AMBULATORY_CARE_PROVIDER_SITE_OTHER): Payer: PPO | Admitting: *Deleted

## 2018-02-25 DIAGNOSIS — Z950 Presence of cardiac pacemaker: Secondary | ICD-10-CM

## 2018-02-25 DIAGNOSIS — I442 Atrioventricular block, complete: Secondary | ICD-10-CM | POA: Diagnosis not present

## 2018-02-25 DIAGNOSIS — R0609 Other forms of dyspnea: Principal | ICD-10-CM

## 2018-02-25 NOTE — Patient Instructions (Signed)
Your physician has requested that you have an echocardiogram. Echocardiography is a painless test that uses sound waves to create images of your heart. It provides your doctor with information about the size and shape of your heart and how well your heart's chambers and valves are working. This procedure takes approximately one hour. There are no restrictions for this procedure.  The [place] office will contact you to schedule this test.

## 2018-02-26 LAB — CUP PACEART INCLINIC DEVICE CHECK
Battery Voltage: 2.74 V
Brady Statistic RA Percent Paced: 14 %
Date Time Interrogation Session: 20191217204716
Implantable Lead Implant Date: 20090820
Implantable Lead Implant Date: 20090820
Implantable Lead Location: 753859
Implantable Pulse Generator Implant Date: 20090820
Lead Channel Impedance Value: 357 Ohm
Lead Channel Impedance Value: 466 Ohm
Lead Channel Pacing Threshold Amplitude: 0.375 V
Lead Channel Sensing Intrinsic Amplitude: 0.3 mV
Lead Channel Setting Pacing Amplitude: 2 V
Lead Channel Setting Sensing Sensitivity: 2 mV
MDC IDC LEAD LOCATION: 753860
MDC IDC MSMT BATTERY IMPEDANCE: 3900 Ohm
MDC IDC MSMT LEADCHNL RA PACING THRESHOLD AMPLITUDE: 0.5 V
MDC IDC MSMT LEADCHNL RA PACING THRESHOLD PULSEWIDTH: 0.5 ms
MDC IDC MSMT LEADCHNL RV PACING THRESHOLD PULSEWIDTH: 0.5 ms
MDC IDC SET LEADCHNL RV PACING PULSEWIDTH: 0.5 ms
MDC IDC STAT BRADY RV PERCENT PACED: 99 % — AB
Pulse Gen Model: 5826
Pulse Gen Serial Number: 1209038

## 2018-02-26 NOTE — Progress Notes (Signed)
Pacemaker check in clinic by industry, Alpha per Ermalinda Barrios, [place]. Normal device function. Thresholds, sensing, impedances consistent with previous measurements. Device programmed to maximize longevity. <1% mode switched, longest 63min 46sec, peak A 197bpm, no EGMs. Device programmed at appropriate safety margins. Histogram distribution appropriate for patient activity level. Estimated longevity 2.25-3.5 years. Patient education completed. ROV with GT/R on 03/26/18.  Reviewed symptoms of dyspnea on exertion with Dr. Lovena Le, who recommended patient have an echo prior to next f/u appointment. Order enter an patient and daughter verbalize agreement with plan.

## 2018-02-27 ENCOUNTER — Other Ambulatory Visit: Payer: Self-pay | Admitting: *Deleted

## 2018-02-27 ENCOUNTER — Telehealth: Payer: Self-pay | Admitting: *Deleted

## 2018-02-27 DIAGNOSIS — R0609 Other forms of dyspnea: Principal | ICD-10-CM

## 2018-02-27 DIAGNOSIS — I10 Essential (primary) hypertension: Secondary | ICD-10-CM

## 2018-02-27 NOTE — Telephone Encounter (Signed)
Order has been placed for patient to obtain labs when she has her echo in [facility] on 03/18/18.  I left message for daughter, Merril Abbe, and let her know to pick up lab slip at the [facility] office located within the hospital.  Let her know to call me back with any questions.

## 2018-02-28 NOTE — Telephone Encounter (Signed)
Spoke with patient's daughter, [name]. She will not be available to take patient for labs, but she will coordinate with patient's friend to ensure patient has labs drawn next week (as ordered by Ermalinda Barrios, PA, on 02/24/18). She is aware of [facility] office location and denies questions or concerns at this time.

## 2018-03-04 ENCOUNTER — Other Ambulatory Visit (HOSPITAL_COMMUNITY)
Admission: RE | Admit: 2018-03-04 | Discharge: 2018-03-04 | Disposition: A | Discharge status: Home / Self Care | Payer: PPO | Source: Ambulatory Visit | Attending: Internal Medicine | Admitting: Internal Medicine

## 2018-03-04 DIAGNOSIS — R0609 Other forms of dyspnea: Secondary | ICD-10-CM | POA: Diagnosis not present

## 2018-03-04 LAB — BASIC METABOLIC PANEL
Anion gap: 9 (ref 5–15)
BUN: 71 mg/dL — ABNORMAL HIGH (ref 8–23)
CO2: 21 mmol/L — ABNORMAL LOW (ref 22–32)
Calcium: 10.1 mg/dL (ref 8.9–10.3)
Chloride: 107 mmol/L (ref 98–111)
Creatinine, Ser: 1.97 mg/dL — ABNORMAL HIGH (ref 0.44–1.00)
GFR calc Af Amer: 25 mL/min — ABNORMAL LOW (ref >60)
GFR, EST NON AFRICAN AMERICAN: 22 mL/min — AB (ref >60)
GLUCOSE: 231 mg/dL — AB (ref 70–99)
Potassium: 5.8 mmol/L — ABNORMAL HIGH (ref 3.5–5.1)
Sodium: 137 mmol/L (ref 135–145)

## 2018-03-06 ENCOUNTER — Encounter (HOSPITAL_COMMUNITY): Payer: Self-pay | Admitting: Emergency Medicine

## 2018-03-06 ENCOUNTER — Inpatient Hospital Stay (HOSPITAL_COMMUNITY)
Admission: EM | Admit: 2018-03-06 | Discharge: 2018-03-11 | DRG: 682 | Disposition: A | Discharge status: Skilled Nursing Facility | Payer: PPO | Attending: Internal Medicine | Admitting: Internal Medicine

## 2018-03-06 ENCOUNTER — Emergency Department (HOSPITAL_COMMUNITY): Payer: PPO

## 2018-03-06 ENCOUNTER — Other Ambulatory Visit: Payer: Self-pay

## 2018-03-06 DIAGNOSIS — Z8249 Family history of ischemic heart disease and other diseases of the circulatory system: Secondary | ICD-10-CM

## 2018-03-06 DIAGNOSIS — Z950 Presence of cardiac pacemaker: Secondary | ICD-10-CM

## 2018-03-06 DIAGNOSIS — E785 Hyperlipidemia, unspecified: Secondary | ICD-10-CM | POA: Diagnosis present

## 2018-03-06 DIAGNOSIS — N289 Disorder of kidney and ureter, unspecified: Secondary | ICD-10-CM | POA: Diagnosis not present

## 2018-03-06 DIAGNOSIS — E43 Unspecified severe protein-calorie malnutrition: Secondary | ICD-10-CM

## 2018-03-06 DIAGNOSIS — R627 Adult failure to thrive: Secondary | ICD-10-CM | POA: Diagnosis not present

## 2018-03-06 DIAGNOSIS — K5909 Other constipation: Secondary | ICD-10-CM | POA: Diagnosis present

## 2018-03-06 DIAGNOSIS — I442 Atrioventricular block, complete: Secondary | ICD-10-CM | POA: Diagnosis not present

## 2018-03-06 DIAGNOSIS — J690 Pneumonitis due to inhalation of food and vomit: Secondary | ICD-10-CM | POA: Diagnosis present

## 2018-03-06 DIAGNOSIS — N179 Acute kidney failure, unspecified: Secondary | ICD-10-CM | POA: Diagnosis not present

## 2018-03-06 DIAGNOSIS — Z66 Do not resuscitate: Secondary | ICD-10-CM | POA: Diagnosis not present

## 2018-03-06 DIAGNOSIS — E11649 Type 2 diabetes mellitus with hypoglycemia without coma: Secondary | ICD-10-CM | POA: Diagnosis present

## 2018-03-06 DIAGNOSIS — R41841 Cognitive communication deficit: Secondary | ICD-10-CM | POA: Diagnosis not present

## 2018-03-06 DIAGNOSIS — E872 Acidosis: Secondary | ICD-10-CM | POA: Diagnosis present

## 2018-03-06 DIAGNOSIS — R1312 Dysphagia, oropharyngeal phase: Secondary | ICD-10-CM | POA: Diagnosis not present

## 2018-03-06 DIAGNOSIS — K224 Dyskinesia of esophagus: Secondary | ICD-10-CM | POA: Diagnosis present

## 2018-03-06 DIAGNOSIS — E871 Hypo-osmolality and hyponatremia: Secondary | ICD-10-CM

## 2018-03-06 DIAGNOSIS — Z515 Encounter for palliative care: Secondary | ICD-10-CM | POA: Diagnosis not present

## 2018-03-06 DIAGNOSIS — T17908A Unspecified foreign body in respiratory tract, part unspecified causing other injury, initial encounter: Secondary | ICD-10-CM

## 2018-03-06 DIAGNOSIS — I10 Essential (primary) hypertension: Secondary | ICD-10-CM | POA: Diagnosis not present

## 2018-03-06 DIAGNOSIS — Z7189 Other specified counseling: Secondary | ICD-10-CM

## 2018-03-06 DIAGNOSIS — R4182 Altered mental status, unspecified: Secondary | ICD-10-CM | POA: Diagnosis not present

## 2018-03-06 DIAGNOSIS — R634 Abnormal weight loss: Secondary | ICD-10-CM | POA: Diagnosis not present

## 2018-03-06 DIAGNOSIS — R410 Disorientation, unspecified: Secondary | ICD-10-CM | POA: Diagnosis not present

## 2018-03-06 DIAGNOSIS — J9601 Acute respiratory failure with hypoxia: Secondary | ICD-10-CM | POA: Diagnosis present

## 2018-03-06 DIAGNOSIS — E1151 Type 2 diabetes mellitus with diabetic peripheral angiopathy without gangrene: Secondary | ICD-10-CM | POA: Diagnosis present

## 2018-03-06 DIAGNOSIS — I9589 Other hypotension: Secondary | ICD-10-CM | POA: Diagnosis present

## 2018-03-06 DIAGNOSIS — J9811 Atelectasis: Secondary | ICD-10-CM | POA: Diagnosis not present

## 2018-03-06 DIAGNOSIS — E875 Hyperkalemia: Secondary | ICD-10-CM | POA: Diagnosis not present

## 2018-03-06 DIAGNOSIS — E861 Hypovolemia: Secondary | ICD-10-CM | POA: Diagnosis not present

## 2018-03-06 DIAGNOSIS — R29898 Other symptoms and signs involving the musculoskeletal system: Secondary | ICD-10-CM

## 2018-03-06 DIAGNOSIS — I129 Hypertensive chronic kidney disease with stage 1 through stage 4 chronic kidney disease, or unspecified chronic kidney disease: Secondary | ICD-10-CM | POA: Diagnosis not present

## 2018-03-06 DIAGNOSIS — I2510 Atherosclerotic heart disease of native coronary artery without angina pectoris: Secondary | ICD-10-CM | POA: Diagnosis not present

## 2018-03-06 DIAGNOSIS — K449 Diaphragmatic hernia without obstruction or gangrene: Secondary | ICD-10-CM | POA: Diagnosis present

## 2018-03-06 DIAGNOSIS — E1122 Type 2 diabetes mellitus with diabetic chronic kidney disease: Secondary | ICD-10-CM | POA: Diagnosis present

## 2018-03-06 DIAGNOSIS — G9340 Encephalopathy, unspecified: Secondary | ICD-10-CM

## 2018-03-06 DIAGNOSIS — R197 Diarrhea, unspecified: Secondary | ICD-10-CM | POA: Diagnosis not present

## 2018-03-06 DIAGNOSIS — R5381 Other malaise: Secondary | ICD-10-CM | POA: Diagnosis not present

## 2018-03-06 DIAGNOSIS — Z7401 Bed confinement status: Secondary | ICD-10-CM | POA: Diagnosis not present

## 2018-03-06 DIAGNOSIS — R4781 Slurred speech: Secondary | ICD-10-CM | POA: Diagnosis not present

## 2018-03-06 DIAGNOSIS — R531 Weakness: Secondary | ICD-10-CM | POA: Diagnosis not present

## 2018-03-06 DIAGNOSIS — E119 Type 2 diabetes mellitus without complications: Secondary | ICD-10-CM | POA: Diagnosis not present

## 2018-03-06 DIAGNOSIS — I959 Hypotension, unspecified: Secondary | ICD-10-CM | POA: Diagnosis not present

## 2018-03-06 DIAGNOSIS — N184 Chronic kidney disease, stage 4 (severe): Secondary | ICD-10-CM | POA: Diagnosis not present

## 2018-03-06 DIAGNOSIS — G9341 Metabolic encephalopathy: Secondary | ICD-10-CM

## 2018-03-06 DIAGNOSIS — E860 Dehydration: Secondary | ICD-10-CM | POA: Diagnosis present

## 2018-03-06 DIAGNOSIS — Z794 Long term (current) use of insulin: Secondary | ICD-10-CM

## 2018-03-06 DIAGNOSIS — R2689 Other abnormalities of gait and mobility: Secondary | ICD-10-CM | POA: Diagnosis not present

## 2018-03-06 DIAGNOSIS — R0902 Hypoxemia: Secondary | ICD-10-CM | POA: Diagnosis not present

## 2018-03-06 DIAGNOSIS — M6281 Muscle weakness (generalized): Secondary | ICD-10-CM | POA: Diagnosis not present

## 2018-03-06 DIAGNOSIS — T17908D Unspecified foreign body in respiratory tract, part unspecified causing other injury, subsequent encounter: Secondary | ICD-10-CM | POA: Diagnosis not present

## 2018-03-06 DIAGNOSIS — E162 Hypoglycemia, unspecified: Secondary | ICD-10-CM

## 2018-03-06 DIAGNOSIS — Z825 Family history of asthma and other chronic lower respiratory diseases: Secondary | ICD-10-CM

## 2018-03-06 LAB — CBG MONITORING, ED
Glucose-Capillary: 57 mg/dL — ABNORMAL LOW (ref 70–99)
Glucose-Capillary: 35 mg/dL — CL (ref 70–99)
Glucose-Capillary: 188 mg/dL — ABNORMAL HIGH (ref 70–99)
Glucose-Capillary: 242 mg/dL — ABNORMAL HIGH (ref 70–99)
Glucose-Capillary: 242 mg/dL — ABNORMAL HIGH (ref 70–99)
Glucose-Capillary: 204 mg/dL — ABNORMAL HIGH (ref 70–99)

## 2018-03-06 LAB — CBC WITH DIFFERENTIAL/PLATELET
ABS IMMATURE GRANULOCYTES: 0.08 10*3/uL — AB (ref 0.00–0.07)
Basophils Absolute: 0 10*3/uL (ref 0.0–0.1)
Basophils Relative: 0 %
Eosinophils Absolute: 0 10*3/uL (ref 0.0–0.5)
Eosinophils Relative: 0 %
HCT: 35.4 % — ABNORMAL LOW (ref 36.0–46.0)
Hemoglobin: 11.3 g/dL — ABNORMAL LOW (ref 12.0–15.0)
Immature Granulocytes: 1 %
Lymphocytes Relative: 5 %
Lymphs Abs: 0.6 10*3/uL — ABNORMAL LOW (ref 0.7–4.0)
MCH: 32.7 pg (ref 26.0–34.0)
MCHC: 31.9 g/dL (ref 30.0–36.0)
MCV: 102.3 fL — ABNORMAL HIGH (ref 80.0–100.0)
Monocytes Absolute: 0.7 10*3/uL (ref 0.1–1.0)
Monocytes Relative: 6 %
NEUTROS ABS: 11.1 10*3/uL — AB (ref 1.7–7.7)
Neutrophils Relative %: 88 %
Platelets: 127 10*3/uL — ABNORMAL LOW (ref 150–400)
RBC: 3.46 MIL/uL — ABNORMAL LOW (ref 3.87–5.11)
RDW: 14.6 % (ref 11.5–15.5)
WBC: 12.6 10*3/uL — ABNORMAL HIGH (ref 4.0–10.5)
nRBC: 0 % (ref 0.0–0.2)

## 2018-03-06 LAB — URINALYSIS, ROUTINE W REFLEX MICROSCOPIC
Bacteria, UA: NONE SEEN
Bilirubin Urine: NEGATIVE
HGB URINE DIPSTICK: NEGATIVE
Ketones, ur: NEGATIVE mg/dL
Leukocytes, UA: NEGATIVE
Nitrite: NEGATIVE
Protein, ur: NEGATIVE mg/dL
Specific Gravity, Urine: 1.016 (ref 1.005–1.030)
pH: 5 (ref 5.0–8.0)

## 2018-03-06 LAB — GLUCOSE, CAPILLARY: GLUCOSE-CAPILLARY: 178 mg/dL — AB (ref 70–99)

## 2018-03-06 LAB — BASIC METABOLIC PANEL
Anion gap: 9 (ref 5–15)
BUN: 100 mg/dL — ABNORMAL HIGH (ref 8–23)
CO2: 18 mmol/L — ABNORMAL LOW (ref 22–32)
Calcium: 9.8 mg/dL (ref 8.9–10.3)
Chloride: 105 mmol/L (ref 98–111)
Creatinine, Ser: 2.7 mg/dL — ABNORMAL HIGH (ref 0.44–1.00)
GFR calc non Af Amer: 15 mL/min — ABNORMAL LOW (ref >60)
GFR, EST AFRICAN AMERICAN: 17 mL/min — AB (ref >60)
Glucose, Bld: 171 mg/dL — ABNORMAL HIGH (ref 70–99)
Potassium: 6.7 mmol/L (ref 3.5–5.1)
Sodium: 132 mmol/L — ABNORMAL LOW (ref 135–145)

## 2018-03-06 LAB — POTASSIUM: Potassium: 6.3 mmol/L (ref 3.5–5.1)

## 2018-03-06 MED ORDER — ACETAMINOPHEN 325 MG PO TABS
650.0000 mg | ORAL_TABLET | Freq: Once | ORAL | Status: AC
  Administered 2018-03-06: 650 mg via ORAL
  Filled 2018-03-06: qty 2

## 2018-03-06 MED ORDER — SODIUM CHLORIDE 0.9 % IV SOLN
INTRAVENOUS | Status: DC
  Administered 2018-03-06 – 2018-03-07 (×5): via INTRAVENOUS

## 2018-03-06 MED ORDER — SODIUM ZIRCONIUM CYCLOSILICATE 10 G PO PACK
10.0000 g | PACK | Freq: Once | ORAL | Status: AC
  Administered 2018-03-06: 10 g via ORAL
  Filled 2018-03-06: qty 1

## 2018-03-06 MED ORDER — SODIUM CHLORIDE 0.9 % IV BOLUS
500.0000 mL | Freq: Once | INTRAVENOUS | Status: AC
  Administered 2018-03-06: 500 mL via INTRAVENOUS

## 2018-03-06 MED ORDER — FENTANYL CITRATE (PF) 100 MCG/2ML IJ SOLN: 100.0000 ug | INTRAMUSCULAR | Status: DC | PRN

## 2018-03-06 MED ORDER — DEXTROSE-NACL 5-0.45 % IV SOLN
INTRAVENOUS | Status: DC
  Administered 2018-03-06: 11:00:00 via INTRAVENOUS

## 2018-03-06 MED ORDER — ACETAMINOPHEN 650 MG RE SUPP: 650.0000 mg | Freq: Four times a day (QID) | RECTAL | Status: DC | PRN

## 2018-03-06 MED ORDER — ACETAMINOPHEN 325 MG PO TABS
650.0000 mg | ORAL_TABLET | Freq: Four times a day (QID) | ORAL | Status: DC | PRN
  Administered 2018-03-06 – 2018-03-07 (×2): 650 mg via ORAL
  Filled 2018-03-06 (×3): qty 2

## 2018-03-06 MED ORDER — ENSURE ENLIVE PO LIQD
237.0000 mL | Freq: Two times a day (BID) | ORAL | Status: DC
  Administered 2018-03-07 – 2018-03-10 (×4): 237 mL via ORAL

## 2018-03-06 MED ORDER — DEXTROSE 50 % IV SOLN
INTRAVENOUS | Status: AC
  Administered 2018-03-06: 75 mL
  Filled 2018-03-06: qty 50

## 2018-03-06 MED ORDER — SODIUM CHLORIDE 0.9 % IV SOLN: 1.0000 g | Freq: Once | INTRAVENOUS | Status: DC

## 2018-03-06 MED ORDER — CALCIUM GLUCONATE-NACL 1-0.675 GM/50ML-% IV SOLN
1.0000 g | Freq: Once | INTRAVENOUS | Status: AC
  Administered 2018-03-06: 1000 mg via INTRAVENOUS
  Filled 2018-03-06: qty 50

## 2018-03-06 MED ORDER — OXYBUTYNIN CHLORIDE 5 MG PO TABS
5.0000 mg | ORAL_TABLET | Freq: Every day | ORAL | Status: DC
  Administered 2018-03-06 – 2018-03-11 (×6): 5 mg via ORAL
  Filled 2018-03-06 (×8): qty 1

## 2018-03-06 MED ORDER — INSULIN ASPART 100 UNIT/ML ~~LOC~~ SOLN
0.0000 [IU] | Freq: Every day | SUBCUTANEOUS | Status: DC
  Administered 2018-03-09: 2 [IU] via SUBCUTANEOUS

## 2018-03-06 MED ORDER — SODIUM BICARBONATE 650 MG PO TABS
650.0000 mg | ORAL_TABLET | Freq: Two times a day (BID) | ORAL | Status: DC
  Administered 2018-03-06 – 2018-03-07 (×3): 650 mg via ORAL
  Filled 2018-03-06 (×6): qty 1

## 2018-03-06 MED ORDER — ASPIRIN 81 MG PO CHEW
81.0000 mg | CHEWABLE_TABLET | Freq: Every morning | ORAL | Status: DC
  Administered 2018-03-06 – 2018-03-11 (×6): 81 mg via ORAL
  Filled 2018-03-06 (×6): qty 1

## 2018-03-06 MED ORDER — ONDANSETRON HCL 4 MG/2ML IJ SOLN: 4.0000 mg | Freq: Once | INTRAMUSCULAR | Status: DC

## 2018-03-06 MED ORDER — ONDANSETRON HCL 4 MG/2ML IJ SOLN: 4.0000 mg | Freq: Four times a day (QID) | INTRAMUSCULAR | Status: DC | PRN

## 2018-03-06 MED ORDER — ALBUTEROL SULFATE (2.5 MG/3ML) 0.083% IN NEBU
10.0000 mg | INHALATION_SOLUTION | Freq: Once | RESPIRATORY_TRACT | Status: AC
  Administered 2018-03-06: 10 mg via RESPIRATORY_TRACT
  Filled 2018-03-06: qty 12

## 2018-03-06 MED ORDER — SODIUM POLYSTYRENE SULFONATE 15 GM/60ML PO SUSP
30.0000 g | Freq: Once | ORAL | Status: AC
  Administered 2018-03-06: 30 g via ORAL
  Filled 2018-03-06: qty 120

## 2018-03-06 MED ORDER — ONDANSETRON HCL 4 MG PO TABS: 4.0000 mg | ORAL_TABLET | Freq: Four times a day (QID) | ORAL | Status: DC | PRN

## 2018-03-06 MED ORDER — ALLOPURINOL 100 MG PO TABS
100.0000 mg | ORAL_TABLET | Freq: Every day | ORAL | Status: DC
  Administered 2018-03-06 – 2018-03-11 (×6): 100 mg via ORAL
  Filled 2018-03-06 (×8): qty 1

## 2018-03-06 MED ORDER — INSULIN ASPART 100 UNIT/ML ~~LOC~~ SOLN
0.0000 [IU] | Freq: Three times a day (TID) | SUBCUTANEOUS | Status: DC
  Administered 2018-03-07 – 2018-03-08 (×4): 2 [IU] via SUBCUTANEOUS
  Administered 2018-03-09 (×2): 3 [IU] via SUBCUTANEOUS
  Administered 2018-03-10 (×2): 1 [IU] via SUBCUTANEOUS
  Administered 2018-03-10: 3 [IU] via SUBCUTANEOUS
  Administered 2018-03-11: 1 [IU] via SUBCUTANEOUS

## 2018-03-06 MED ORDER — DEXTROSE 50 % IV SOLN
INTRAVENOUS | Status: AC
  Administered 2018-03-06: 25 mL
  Filled 2018-03-06: qty 50

## 2018-03-06 NOTE — ED Notes (Signed)
Date and time results received: 03/06/18 1014  Test: Potassium Critical Value: 6.7  Name of Provider Notified: Dr. Eulis Foster  Orders Received? Or Actions Taken?: See chart

## 2018-03-06 NOTE — ED Notes (Signed)
Pt has another episode for weakness, cool to touch, slurred speak, complains of weakness. BS 242, low BP, pt in trendelenburg and notified EDP.

## 2018-03-06 NOTE — ED Triage Notes (Signed)
Pt was last seen normal around 9pm last night when she went to bed.  Son found her this morning attempting to get dressed and was very uncoordinated and difficult to understand.

## 2018-03-06 NOTE — ED Notes (Signed)
Assisted pt on and off bed pan

## 2018-03-06 NOTE — H&P (Signed)
History and Physical    ARIENNE GARTIN CHY:[id number] DOB: [birth year] DOA: 03/06/2018  PCP: Iona Beard, MD   Patient coming from: Home  Chief Complaint: AMS/Weakness  HPI: Bridget Rowland is a 84 y.o. female with medical history significant for CAD, diabetes type 2, dyslipidemia, hypertension, and CKD-likely stage IV with prior creatinine known to be 1.97 who presented to the ED for evaluation of altered mental status after her son found her at home quite weak and confused.  According to the daughter at the bedside, she has been coming to stay with her mother several times per week as she has been more generally weak and has had a poor appetite.  She states that this is been going on for the last 6 weeks.  She was actually doing quite well yesterday at a Christmas party that the family had and did not expect to find her in such a condition today.  Patient states that she has been having worsening urine output as well and has been trying to take her home medications as otherwise prescribed.  She has been losing weight as a result of her poor appetite but denies any nausea, vomiting, diarrhea, chest pain, or shortness of breath.   ED Course: Vital signs demonstrate some low blood pressure readings that have been confirmed manually.  She is noted to have a creatinine of 2.7 with a baseline just 2 days ago of 1.9.  BUN is 100 and WBC count is 12.6, sodium 132, potassium 6.3.  Hemoglobin is 11.3 with platelet count of 127.  CT of the head was performed with no acute findings aside from some atrophy.  Patient has been given some calcium gluconate as well as Lokelma and has had no significant findings on telemetry.  She has been started on normal saline at 125 cc/h and has received a 500 mL bolus.  Family members at bedside understand that she has an overall poor prognosis with little chance for survival past 6 months.  Review of Systems: All others reviewed and otherwise negative.  Past Medical History:    Diagnosis Date  . CAD (coronary artery disease)   . Diabetes mellitus   . Ecchymosis 11/01/2014   Has bruising left inner labia  . Hyperlipidemia   . Hypertension   . Other specified cardiac dysrhythmias(427.89)     Past Surgical History:  Procedure Laterality Date  . APPENDECTOMY    . BACK SURGERY  1995  . CARDIAC CATHETERIZATION    . COLONOSCOPY  08/22/2011   Procedure: COLONOSCOPY;  Surgeon: Daneil Dolin, MD;  Location: [facility];  Service: Endoscopy;  Laterality: N/A;  8:30 AM  . EYE SURGERY    . foot surgery Left   . PACEMAKER INSERTION  10/30/2007   [employer] Zephyr dual lead pacemaker  . TONSILLECTOMY AND ADENOIDECTOMY       reports that she has never smoked. She has never used smokeless tobacco. She reports current alcohol use. She reports that she does not use drugs.  Allergies  Allergen Reactions  . Actos [Pioglitazone Hydrochloride]     Reaction:unknown    Family History  Problem Relation Age of Onset  . Heart disease Father   . Other Sister        stomach problems  . Cancer Daughter        in remission  . Cancer Maternal Grandfather   . COPD Sister   . Congestive Heart Failure Sister     Prior to Admission medications  Medication Sig Start Date End Date Taking? Authorizing Provider  allopurinol (ZYLOPRIM) 100 MG tablet Take 100 mg by mouth daily.   Yes [provider]  amitriptyline (ELAVIL) 25 MG tablet Take 25 mg by mouth at bedtime.   Yes [provider]  aspirin 81 MG tablet Take 81 mg by mouth every morning.    Yes [provider]  glipiZIDE (GLUCOTROL XL) 2.5 MG 24 hr tablet Take 2.5 mg by mouth 2 (two) times daily with breakfast and lunch.   Yes [provider]  Melatonin 10 MG CAPS Take by mouth at bedtime.   Yes [provider]  metFORMIN (GLUCOPHAGE) 1000 MG tablet Take 1,000 mg by mouth 2 (two) times daily with a meal.    Yes [provider]  metoprolol succinate (TOPROL-XL) 25 MG 24  hr tablet Take 1 tablet (25 mg total) by mouth daily. Take with or immediately following a meal. 02/24/18 05/25/18 Yes Imogene Burn, PA-C  oxybutynin (DITROPAN) 5 MG tablet Take 5 mg by mouth daily.    Yes [provider]  UNABLE TO FIND Take 1 capsule by mouth daily. Med Name: Triple Chlorophyll   Yes [provider]  valsartan-hydrochlorothiazide (DIOVAN HCT) 80-12.5 MG tablet Take 1 tablet by mouth daily. 02/24/18  Yes Imogene Burn, PA-C    Physical Exam: Vitals:   03/06/18 1430 03/06/18 1431 03/06/18 1500 03/06/18 1530  BP: (!) 89/38 (!) 85/45 (!) 78/38 (!) 95/54  Pulse:    88  Resp: (!) 22 19  18   Temp:      TempSrc:      SpO2:   100% 100%  Weight:      Height:        Constitutional: NAD, calm, comfortable Vitals:   03/06/18 1430 03/06/18 1431 03/06/18 1500 03/06/18 1530  BP: (!) 89/38 (!) 85/45 (!) 78/38 (!) 95/54  Pulse:    88  Resp: (!) 22 19  18   Temp:      TempSrc:      SpO2:   100% 100%  Weight:      Height:       Eyes: lids and conjunctivae normal ENMT: Mucous membranes are dry. Neck: normal, supple Respiratory: clear to auscultation bilaterally. Normal respiratory effort. No accessory muscle use.  Cardiovascular: Regular rate and rhythm, no murmurs. No extremity edema. Abdomen: no tenderness, no distention. Bowel sounds positive.  Musculoskeletal:  No joint deformity upper and lower extremities.   Skin: no rashes, lesions, ulcers.  Psychiatric: Normal judgment and insight. Alert and oriented x 3. Normal mood.   Labs on Admission: I have personally reviewed following labs and imaging studies  CBC: Recent Labs  Lab 03/06/18 0935  WBC 12.6*  NEUTROABS 11.1*  HGB 11.3*  HCT 35.4*  MCV 102.3*  PLT 277*   Basic Metabolic Panel: Recent Labs  Lab 03/04/18 1040 03/06/18 0935 03/06/18 1347  NA 137 132*  --   K 5.8* 6.7* 6.3*  CL 107 105  --   CO2 21* 18*  --   GLUCOSE 231* 171*  --   BUN 71* 100*  --   CREATININE 1.97*  2.70*  --   CALCIUM 10.1 9.8  --    GFR: Estimated Creatinine Clearance: 9.2 mL/min (A) (by C-G formula based on SCr of 2.7 mg/dL (H)). Liver Function Tests: No results for input(s): AST, ALT, ALKPHOS, BILITOT, PROT, ALBUMIN in the last 168 hours. No results for input(s): LIPASE, AMYLASE in the last 168 hours.  No results for input(s): AMMONIA in the last 168 hours. Coagulation Profile: No results for input(s): INR, PROTIME in the last 168 hours. Cardiac Enzymes: No results for input(s): CKTOTAL, CKMB, CKMBINDEX, TROPONINI in the last 168 hours. BNP (last 3 results) No results for input(s): PROBNP in the last 8760 hours. HbA1C: No results for input(s): HGBA1C in the last 72 hours. CBG: Recent Labs  Lab 03/06/18 0846 03/06/18 0941 03/06/18 1120 03/06/18 1418 03/06/18 1452  GLUCAP 57* 35* 188* 242* 242*   Lipid Profile: No results for input(s): CHOL, HDL, LDLCALC, TRIG, CHOLHDL, LDLDIRECT in the last 72 hours. Thyroid Function Tests: No results for input(s): TSH, T4TOTAL, FREET4, T3FREE, THYROIDAB in the last 72 hours. Anemia Panel: No results for input(s): VITAMINB12, FOLATE, FERRITIN, TIBC, IRON, RETICCTPCT in the last 72 hours. Urine analysis:    Component Value Date/Time   COLORURINE YELLOW 03/06/2018 1104   APPEARANCEUR CLEAR 03/06/2018 1104   LABSPEC 1.016 03/06/2018 1104   PHURINE 5.0 03/06/2018 1104   GLUCOSEU >=500 (A) 03/06/2018 1104   HGBUR NEGATIVE 03/06/2018 1104   BILIRUBINUR NEGATIVE 03/06/2018 1104   KETONESUR NEGATIVE 03/06/2018 1104   PROTEINUR NEGATIVE 03/06/2018 1104   NITRITE NEGATIVE 03/06/2018 1104   LEUKOCYTESUR NEGATIVE 03/06/2018 1104    Radiological Exams on Admission: Ct Head Wo Contrast  Result Date: 03/06/2018 CLINICAL DATA:  Confusion, altered mental status. EXAM: CT HEAD WITHOUT CONTRAST TECHNIQUE: Contiguous axial images were obtained from the base of the skull through the vertex without intravenous contrast. COMPARISON:  01/20/2004  FINDINGS: Brain: There is atrophy and chronic small vessel disease changes. Vascular: No acute intracranial abnormality. Specifically, no hemorrhage, hydrocephalus, mass lesion, acute infarction, or significant intracranial injury. Skull: No acute calvarial abnormality. Sinuses/Orbits: Visualized paranasal sinuses and mastoids clear. Orbital soft tissues unremarkable. Other: None IMPRESSION: Atrophy, chronic microvascular disease. No acute intracranial abnormality. Electronically Signed   By: Rolm Baptise M.D.   On: 03/06/2018 12:51    Assessment/Plan Principal Problem:   Acute metabolic encephalopathy Active Problems:   Essential hypertension   Weight loss, unintentional   AKI (acute kidney injury) ([facility])   Failure to thrive in adult   Type 2 diabetes mellitus without complication (HCC)   Hyperkalemia   Hyponatremia   1. Acute metabolic encephalopathy likely secondary to AKI.  Continue to monitor closely with neuro checks and treat as noted below. 2. AKI likely on CKD stage IV.  She is a poor candidate for any aggressive measures to include dialysis.  Family members agree that there should be a trial of IV fluid and potassium correction and are agreeable to home hospice on discharge.  Will monitor strict I's and O's as well as labs in a.m. and avoid nephrotoxic agents. 3. Hyperkalemia-secondary to above.  Continue monitor on telemetry and administer Kayexalate since patient has had poor response to [facility]. 4. Mild hyponatremia.  Maintain on normal saline and recheck labs in a.m.  This is all likely due to dehydration and hypovolemia. 5. Metabolic acidosis.  Placed on sodium bicarbonate tabs and check labs in a.m. 6. Hypertension.  Hold home blood pressure agents due to soft blood pressure readings and monitor in stepdown unit in case pressor agents are needed. 7. Type 2 diabetes.  Patient has blood glucose of 171 in ED.  Will place on sliding scale insulin as well as carb modified/heart healthy  diet. 8. Weight loss with failure to thrive.  Very poor prognosis and will likely need discharge to home with hospice depending on clinical picture  in next 1 to 2 days.  DVT prophylaxis: SCDs Code Status: DNR Family Communication: Son and daughter at bedside Disposition Plan:Admit for treatment of AKI with IVF and treatment of potassion; likely will need to DC with home hospice Consults called:None Admission status: Inpatient, stepdown unit   Joycelynn Fritsche Darleen Crocker DO [facility] Pager [phone]  If 7PM-7AM, please contact night-coverage [email or web address] Password [facility]  03/06/2018, 3:41 PM

## 2018-03-06 NOTE — ED Notes (Signed)
CRITICAL VALUE ALERT  Critical Value:  Potassium 6.3  Date & Time Notied:  03/06/18  14:25  Provider Notified: Dr Eulis Foster

## 2018-03-06 NOTE — ED Notes (Signed)
Spoke with Barnett Applebaum, RN [facility] regarding about getting hospital bed in ED while holding pt for admission bed- [facility] is working on this.

## 2018-03-06 NOTE — ED Notes (Signed)
RT notified for neb tx. 

## 2018-03-06 NOTE — ED Notes (Signed)
Family arrived and is at bedside.  

## 2018-03-06 NOTE — ED Notes (Signed)
Gave pt remainder of first amp of D50, plus a full amp per EDP. Pt eating yogurt at this time. Pt tolerating well.

## 2018-03-06 NOTE — ED Notes (Signed)
Pt was able to eat 75% of a container of yogurt.  Kept stating she just wasn't hungry and has not been for quite a while.  Family states it is very hard to get her to eat or drink anything.

## 2018-03-06 NOTE — ED Notes (Signed)
Low BP, EDP aware, orders given

## 2018-03-06 NOTE — ED Notes (Signed)
EDP at bedside  

## 2018-03-06 NOTE — ED Notes (Signed)
Pt c/o LT shoulder pain. Family requesting pain medication. EDP notified and is at bedside.

## 2018-03-06 NOTE — ED Notes (Signed)
Phlebotomy at bedside.

## 2018-03-06 NOTE — ED Notes (Signed)
Pt eating vanilla sugar free pudding. Pt has swallow test last week per family. Pt does not do well with water.

## 2018-03-06 NOTE — ED Provider Notes (Signed)
[facility] EMERGENCY DEPARTMENT Provider Note   CSN: [id number] Arrival date & time: 03/06/18  5732     History   Chief Complaint Chief Complaint  Patient presents with  . Altered Mental Status    HPI Bridget Rowland is a 84 y.o. female.  HPI   She presents for evaluation of altered mental status, per family member who found her at home.  Patient lives alone.  She recalls having holiday celebrations yesterday with family members.  She reports she did not eat yet this morning and is not currently hungry.  She denies recent fever, chills, cough, shortness of breath, chest pain, focal weakness or paresthesia.  There are no other known modifying factors.  Past Medical History:  Diagnosis Date  . CAD (coronary artery disease)   . Diabetes mellitus   . Ecchymosis 11/01/2014   Has bruising left inner labia  . Hyperlipidemia   . Hypertension   . Other specified cardiac dysrhythmias(427.89)     Patient Active Problem List   Diagnosis Date Noted  . Dyspnea on exertion 02/24/2018  . Weight loss, unintentional 02/24/2018  . Ecchymosis 11/01/2014  . Other specified cardiac dysrhythmias(427.89)   . PPM-St.Jude 12/01/2008  . DM 11/03/2008  . Essential hypertension 11/03/2008  . Coronary atherosclerosis 11/03/2008    Past Surgical History:  Procedure Laterality Date  . APPENDECTOMY    . BACK SURGERY  1995  . CARDIAC CATHETERIZATION    . COLONOSCOPY  08/22/2011   Procedure: COLONOSCOPY;  Surgeon: Daneil Dolin, MD;  Location: [facility];  Service: Endoscopy;  Laterality: N/A;  8:30 AM  . EYE SURGERY    . foot surgery Left   . PACEMAKER INSERTION  10/30/2007   St. Jude Zephyr dual lead pacemaker  . TONSILLECTOMY AND ADENOIDECTOMY       OB History    Gravida  2   Para  2   Term      Preterm      AB      Living  2     SAB      TAB      Ectopic      Multiple      Live Births               Home Medications    Prior to Admission medications     Medication Sig Start Date End Date Taking? Authorizing Provider  allopurinol (ZYLOPRIM) 100 MG tablet Take 100 mg by mouth daily.   Yes [provider]  amitriptyline (ELAVIL) 25 MG tablet Take 25 mg by mouth at bedtime.   Yes [provider]  aspirin 81 MG tablet Take 81 mg by mouth every morning.    Yes [provider]  glipiZIDE (GLUCOTROL XL) 2.5 MG 24 hr tablet Take 2.5 mg by mouth 2 (two) times daily with breakfast and lunch.   Yes [provider]  Melatonin 10 MG CAPS Take by mouth at bedtime.   Yes [provider]  metFORMIN (GLUCOPHAGE) 1000 MG tablet Take 1,000 mg by mouth 2 (two) times daily with a meal.    Yes [provider]  metoprolol succinate (TOPROL-XL) 25 MG 24 hr tablet Take 1 tablet (25 mg total) by mouth daily. Take with or immediately following a meal. 02/24/18 05/25/18 Yes Imogene Burn, PA-C  oxybutynin (DITROPAN) 5 MG tablet Take 5 mg by mouth daily.    Yes [provider]  UNABLE TO FIND Take 1 capsule by mouth  daily. Med Name: Triple Chlorophyll   Yes [provider]  valsartan-hydrochlorothiazide (DIOVAN HCT) 80-12.5 MG tablet Take 1 tablet by mouth daily. 02/24/18  Yes Imogene Burn, PA-C    Family History Family History  Problem Relation Age of Onset  . Heart disease Father   . Other Sister        stomach problems  . Cancer Daughter        in remission  . Cancer Maternal Grandfather   . COPD Sister   . Congestive Heart Failure Sister     Social History Social History   Tobacco Use  . Smoking status: Never Smoker  . Smokeless tobacco: Never Used  Substance Use Topics  . Alcohol use: Yes    Alcohol/week: 0.0 standard drinks    Comment: once a month  . Drug use: No     Allergies   Actos [pioglitazone hydrochloride]   Review of Systems Review of Systems  All other systems reviewed and are negative.    Physical Exam Updated Vital Signs BP (!) 78/38 (BP Location:  Right Arm) Comment: manuel  Pulse 97   Temp (!) 97.5 F (36.4 C) (Oral)   Resp 19   Ht 5\' 2"  (1.575 m)   Wt 42 kg   SpO2 100%   BMI 16.94 kg/m   Physical Exam Vitals signs and nursing note reviewed.  Constitutional:      Appearance: Normal appearance. She is well-developed. She is not ill-appearing or diaphoretic.     Comments: Elderly, frail  HENT:     Head: Normocephalic and atraumatic.     Right Ear: External ear normal.     Left Ear: External ear normal.     Nose: Nose normal. No congestion or rhinorrhea.     Mouth/Throat:     Mouth: Mucous membranes are moist.  Eyes:     Conjunctiva/sclera: Conjunctivae normal.     Pupils: Pupils are equal, round, and reactive to light.  Neck:     Musculoskeletal: Normal range of motion and neck supple.     Trachea: Phonation normal.  Cardiovascular:     Rate and Rhythm: Normal rate and regular rhythm.  Pulmonary:     Effort: Pulmonary effort is normal.     Breath sounds: Normal breath sounds.  Chest:     Chest wall: No tenderness.  Abdominal:     General: There is no distension.     Palpations: Abdomen is soft.     Tenderness: There is no abdominal tenderness. There is no guarding.  Musculoskeletal: Normal range of motion.  Skin:    General: Skin is warm and dry.  Neurological:     Mental Status: She is alert and oriented to person, place, and time.     Motor: No abnormal muscle tone.     Comments: Mild dysarthria which improves with talking.  No aphasia.  No nystagmus.  No pronator drift.  Psychiatric:        Mood and Affect: Mood normal.        Behavior: Behavior normal.      ED Treatments / Results  Labs (all labs ordered are listed, but only abnormal results are displayed) Labs Reviewed  URINALYSIS, ROUTINE W REFLEX MICROSCOPIC - Abnormal; Notable for the following components:      Result Value   Glucose, UA >=500 (*)    All other components within normal limits  BASIC METABOLIC PANEL - Abnormal; Notable for the  following components:   Sodium 132 (*)  Potassium 6.7 (*)    CO2 18 (*)    Glucose, Bld 171 (*)    BUN 100 (*)    Creatinine, Ser 2.70 (*)    GFR calc non Af Amer 15 (*)    GFR calc Af Amer 17 (*)    All other components within normal limits  CBC WITH DIFFERENTIAL/PLATELET - Abnormal; Notable for the following components:   WBC 12.6 (*)    RBC 3.46 (*)    Hemoglobin 11.3 (*)    HCT 35.4 (*)    MCV 102.3 (*)    Platelets 127 (*)    Neutro Abs 11.1 (*)    Lymphs Abs 0.6 (*)    Abs Immature Granulocytes 0.08 (*)    All other components within normal limits  POTASSIUM - Abnormal; Notable for the following components:   Potassium 6.3 (*)    All other components within normal limits  CBG MONITORING, ED - Abnormal; Notable for the following components:   Glucose-Capillary 57 (*)    All other components within normal limits  CBG MONITORING, ED - Abnormal; Notable for the following components:   Glucose-Capillary 35 (*)    All other components within normal limits  CBG MONITORING, ED - Abnormal; Notable for the following components:   Glucose-Capillary 188 (*)    All other components within normal limits  CBG MONITORING, ED - Abnormal; Notable for the following components:   Glucose-Capillary 242 (*)    All other components within normal limits  CBG MONITORING, ED - Abnormal; Notable for the following components:   Glucose-Capillary 242 (*)    All other components within normal limits   Lab Results  Component Value Date   K 6.3 (HH) 03/06/2018   K 6.7 (HH) 03/06/2018   K 5.8 (H) 03/04/2018   Lab Results  Component Value Date   CREATININE 2.70 (H) 03/06/2018   CREATININE 1.97 (H) 03/04/2018         EKG None  Radiology Ct Head Wo Contrast  Result Date: 03/06/2018 CLINICAL DATA:  Confusion, altered mental status. EXAM: CT HEAD WITHOUT CONTRAST TECHNIQUE: Contiguous axial images were obtained from the base of the skull through the vertex without intravenous  contrast. COMPARISON:  01/20/2004 FINDINGS: Brain: There is atrophy and chronic small vessel disease changes. Vascular: No acute intracranial abnormality. Specifically, no hemorrhage, hydrocephalus, mass lesion, acute infarction, or significant intracranial injury. Skull: No acute calvarial abnormality. Sinuses/Orbits: Visualized paranasal sinuses and mastoids clear. Orbital soft tissues unremarkable. Other: None IMPRESSION: Atrophy, chronic microvascular disease. No acute intracranial abnormality. Electronically Signed   By: Rolm Baptise M.D.   On: 03/06/2018 12:51    Procedures .Critical Care Performed by: Daleen Bo, MD Authorized by: Daleen Bo, MD   Critical care provider statement:    Critical care time (minutes):  75   Critical care start time:  03/06/2018 9:10 AM   Critical care end time:  03/06/2018 1:56 PM   Critical care time was exclusive of:  Separately billable procedures and treating other patients   Critical care was time spent personally by me on the following activities:  Blood draw for specimens, development of treatment plan with patient or surrogate, discussions with consultants, evaluation of patient's response to treatment, examination of patient, obtaining history from patient or surrogate, ordering and performing treatments and interventions, ordering and review of laboratory studies, pulse oximetry, re-evaluation of patient's condition, review of old charts and ordering and review of radiographic studies   (including critical care time)  Medications  Ordered in ED Medications  0.9 %  sodium chloride infusion ( Intravenous New Bag/Given 03/06/18 1039)  dextrose 5 %-0.45 % sodium chloride infusion ( Intravenous Rate/Dose Verify 03/06/18 1043)  calcium gluconate 1 g/ 50 mL sodium chloride IVPB (1,000 mg Intravenous New Bag/Given 03/06/18 1447)  dextrose 50 % solution (25 mLs  Given 03/06/18 0852)  sodium chloride 0.9 % bolus 500 mL (0 mLs Intravenous Stopped  03/06/18 1048)  dextrose 50 % solution (75 mLs  Given 03/06/18 0945)  albuterol (PROVENTIL) (2.5 MG/3ML) 0.083% nebulizer solution 10 mg (10 mg Nebulization Given 03/06/18 1103)  sodium zirconium cyclosilicate (LOKELMA) packet 10 g (10 g Oral Given 03/06/18 1215)  acetaminophen (TYLENOL) tablet 650 mg (650 mg Oral Given 03/06/18 1047)     Initial Impression / Assessment and Plan / ED Course  I have reviewed the triage vital signs and the nursing notes.  Pertinent labs & imaging results that were available during my care of the patient were reviewed by me and considered in my medical decision making (see chart for details).  Clinical Course as of Mar 06 1516  Thu Mar 06, 2018  4765 Patient's son is here now and is able to give history that he found the patient alert but felt that she had slurred speech and could not move her right arm.  At that time he called EMS who arrived and found her to have a normal blood sugar, and transferred her here for possible broke.  Her son states that at this time he thinks her speech has returned to normal however she does not have her teeth and so it is a little bit hard to understand her.  At this time her blood sugar is being repeated, and found to be low again so she is given additional D50.   [EW]  1046 I was called to the room because she is having left shoulder pain.  At this time the patient localizes her pain to her trapezius.  She states it hurts when she moves her left shoulder.  There is been no trauma.  She has had ongoing neck pain for which she is using lidocaine patches.  Tylenol ordered.  Current treatments being done.  Family informed she will likely need to be admitted, once stabilized.   [EW]  1332 Normal except glucose high  Urinalysis, Routine w reflex microscopic(!) [EW]  1332 High  CBG monitoring, ED(!) [EW]  1336 Normal except sodium low, potassium high, CO2 low, glucose high, BUN high, creatinine high, GFR low  Basic metabolic  panel(!!) [EW]  1337 Normal except white count high, hemoglobin low, MCV high  CBC with Differential(!) [EW]  1337 No CVA or tumor, images reviewed by me  CT Head Wo Contrast [EW]  1440 Blood pressure transiently dropped to 45 systolic while sitting, improved to 89 systolic when supine.  IV saline bolus ordered.  Also ordered calcium gluconate, since she continues to be hyperkalemic.  At this time patient has right arm weakness as compared to the left, which has been a complaint earlier prior to arrival.  She did not previously manifest right arm weakness on exam.  CBG is greater than 200 at this time.   [EW]  4650 Blood pressure now 89 systolic taken on right thigh.  This time she is able to hold her arms above her shoulder level without dropping them.  This is an improvement from 30 minutes ago.  Patient family was updated on findings and plan.   [EW]  Clinical Course User Index [EW] Daleen Bo, MD     Patient Vitals for the past 24 hrs:  BP Temp Temp src Pulse Resp SpO2 Height Weight  03/06/18 1500 (!) 78/38 - - - - 100 % - -  03/06/18 1431 (!) 85/45 - - - 19 - - -  03/06/18 1430 (!) 89/38 - - - (!) 22 - - -  03/06/18 1230 109/84 - - 97 17 100 % - -  03/06/18 1200 (!) 101/59 - - - 13 - - -  03/06/18 1130 (!) 114/51 - - - 13 - - -  03/06/18 1103 - - - - - 98 % - -  03/06/18 1030 (!) 100/55 - - 88 17 100 % - -  03/06/18 0940 (!) 96/47 - - 68 15 100 % - -  03/06/18 0854 (!) 145/96 (!) 97.5 F (36.4 C) Oral 87 (!) 21 92 % - -  03/06/18 0841 - - Oral - - - - -  03/06/18 3662 - - - - - - 5\' 2"  (1.575 m) 42 kg    1:55 PM Reevaluation with update and discussion. After initial assessment and treatment, an updated evaluation reveals patient remains stable after treatment with IV glucose and eating some.  Findings discussed with patient and family members, all questions answered. Daleen Bo   Medical Decision Making: Weakness with confusion, improved after treatment of  hypoglycemia.  Doubt CVA, metabolic instability or impending vascular collapse.  Blood pressure improved.  Patient on diuresis, contributing to worsening renal function.  Patient on 2 hypoglycemic agents, likely causing hypoglycemia in the face of decreased oral intake.  Patient will need hospitalization for continued monitoring and treatment.  CRITICAL CARE-yes Performed by: Daleen Bo  Nursing Notes Reviewed/ Care Coordinated Applicable Imaging Reviewed Interpretation of Laboratory Data incorporated into ED treatment  2:42 PM-Consult complete with hospitalist. Patient case explained and discussed.  He agrees to admit patient for further evaluation and treatment. Call ended at 1510  Plan : Admit  Final Clinical Impressions(s) / ED Diagnoses   Final diagnoses:  Hypoglycemia  Hyperkalemia  Renal insufficiency  Hypotension due to hypovolemia  Right arm weakness    ED Discharge Orders    None       Daleen Bo, MD 03/06/18 1517

## 2018-03-06 NOTE — ED Notes (Signed)
ED TO INPATIENT HANDOFF REPORT  Name/Age/Gender Bridget Rowland 84 y.o. female  Code Status    Code Status Orders  (From admission, onward)         Start     Ordered   03/06/18 1611  Do not attempt resuscitation (DNR)  Continuous    Question Answer Comment  In the event of cardiac or respiratory ARREST Do not call a "code blue"   In the event of cardiac or respiratory ARREST Do not perform Intubation, CPR, defibrillation or ACLS   In the event of cardiac or respiratory ARREST Use medication by any route, position, wound care, and other measures to relive pain and suffering. May use oxygen, suction and manual treatment of airway obstruction as needed for comfort.      03/06/18 1614        Code Status History    This patient has a current code status but no historical code status.      Home/SNF/Other Home  Chief Complaint Weakness  Level of Care/Admitting Diagnosis ED Disposition    ED Disposition Condition [facility] Area: [facility] [[id number]]  Level of Care: Telemetry [5]  Diagnosis: AKI (acute kidney injury) [facility]) [[id number]]  Admitting Physician: Rodena Goldmann [[id number]]  Attending Physician: Rodena Goldmann [[id number]]  Estimated length of stay: past midnight tomorrow  Certification:: I certify this patient will need inpatient services for at least 2 midnights  PT Class (Do Not Modify): Inpatient [101]  PT Acc Code (Do Not Modify): Private [1]       Medical History Past Medical History:  Diagnosis Date  . CAD (coronary artery disease)   . Diabetes mellitus   . Ecchymosis 11/01/2014   Has bruising left inner labia  . Hyperlipidemia   . Hypertension   . Other specified cardiac dysrhythmias(427.89)     Allergies Allergies  Allergen Reactions  . Actos [Pioglitazone Hydrochloride]     Reaction:unknown    IV Location/Drains/Wounds Patient Lines/Drains/Airways Status   Active Line/Drains/Airways    Name:   Placement date:    Placement time:   Site:   Days:   Peripheral IV 03/06/18 Left Antecubital   03/06/18    0851    Antecubital   less than 1          Labs/Imaging Results for orders placed or performed during the hospital encounter of 03/06/18 (from the past 48 hour(s))  CBG monitoring, ED     Status: Abnormal   Collection Time: 03/06/18  8:46 AM  Result Value Ref Range   Glucose-Capillary 57 (L) 70 - 99 mg/dL  Basic metabolic panel     Status: Abnormal   Collection Time: 03/06/18  9:35 AM  Result Value Ref Range   Sodium 132 (L) 135 - 145 mmol/L   Potassium 6.7 (HH) 3.5 - 5.1 mmol/L    Comment: CRITICAL RESULT CALLED TO, READ BACK BY AND VERIFIED WITH: C.KEMP @ 1006 ON 12.26.19 BY L.BOWMAN    Chloride 105 98 - 111 mmol/L   CO2 18 (L) 22 - 32 mmol/L   Glucose, Bld 171 (H) 70 - 99 mg/dL   BUN 100 (H) 8 - 23 mg/dL   Creatinine, Ser 2.70 (H) 0.44 - 1.00 mg/dL   Calcium 9.8 8.9 - 10.3 mg/dL   GFR calc non Af Amer 15 (L) >60 mL/min   GFR calc Af Amer 17 (L) >60 mL/min   Anion gap 9 5 - 15    Comment: Performed  at [facility], [address], [place], [place] [address]  CBC with Differential     Status: Abnormal   Collection Time: 03/06/18  9:35 AM  Result Value Ref Range   WBC 12.6 (H) 4.0 - 10.5 K/uL   RBC 3.46 (L) 3.87 - 5.11 MIL/uL   Hemoglobin 11.3 (L) 12.0 - 15.0 g/dL   HCT 35.4 (L) 36.0 - 46.0 %   MCV 102.3 (H) 80.0 - 100.0 fL   MCH 32.7 26.0 - 34.0 pg   MCHC 31.9 30.0 - 36.0 g/dL   RDW 14.6 11.5 - 15.5 %   Platelets 127 (L) 150 - 400 K/uL   nRBC 0.0 0.0 - 0.2 %   Neutrophils Relative % 88 %   Neutro Abs 11.1 (H) 1.7 - 7.7 K/uL   Lymphocytes Relative 5 %   Lymphs Abs 0.6 (L) 0.7 - 4.0 K/uL   Monocytes Relative 6 %   Monocytes Absolute 0.7 0.1 - 1.0 K/uL   Eosinophils Relative 0 %   Eosinophils Absolute 0.0 0.0 - 0.5 K/uL   Basophils Relative 0 %   Basophils Absolute 0.0 0.0 - 0.1 K/uL   Immature Granulocytes 1 %   Abs Immature Granulocytes 0.08 (H) 0.00 - 0.07 K/uL    Comment:  Performed at [facility], [address], [place], [place] [address]  CBG monitoring, ED     Status: Abnormal   Collection Time: 03/06/18  9:41 AM  Result Value Ref Range   Glucose-Capillary 35 (LL) 70 - 99 mg/dL   Comment 1 Notify RN   Urinalysis, Routine w reflex microscopic     Status: Abnormal   Collection Time: 03/06/18 11:04 AM  Result Value Ref Range   Color, Urine YELLOW YELLOW   APPearance CLEAR CLEAR   Specific Gravity, Urine 1.016 1.005 - 1.030   pH 5.0 5.0 - 8.0   Glucose, UA >=500 (A) NEGATIVE mg/dL   Hgb urine dipstick NEGATIVE NEGATIVE   Bilirubin Urine NEGATIVE NEGATIVE   Ketones, ur NEGATIVE NEGATIVE mg/dL   Protein, ur NEGATIVE NEGATIVE mg/dL   Nitrite NEGATIVE NEGATIVE   Leukocytes, UA NEGATIVE NEGATIVE   RBC / HPF 0-5 0 - 5 RBC/hpf   WBC, UA 0-5 0 - 5 WBC/hpf   Bacteria, UA NONE SEEN NONE SEEN   Squamous Epithelial / LPF 0-5 0 - 5   Hyaline Casts, UA PRESENT     Comment: Performed at [facility], [address], [place], [place] [address]  CBG monitoring, ED     Status: Abnormal   Collection Time: 03/06/18 11:20 AM  Result Value Ref Range   Glucose-Capillary 188 (H) 70 - 99 mg/dL  Potassium     Status: Abnormal   Collection Time: 03/06/18  1:47 PM  Result Value Ref Range   Potassium 6.3 (HH) 3.5 - 5.1 mmol/L    Comment: CRITICAL RESULT CALLED TO, READ BACK BY AND VERIFIED WITH: COCKRHAM,R AT 1425 ON 12.26.2019 BY ISLEY,B Performed at [facility], [address] [place], [place] [address]   CBG monitoring, ED     Status: Abnormal   Collection Time: 03/06/18  2:18 PM  Result Value Ref Range   Glucose-Capillary 242 (H) 70 - 99 mg/dL  CBG monitoring, ED     Status: Abnormal   Collection Time: 03/06/18  2:52 PM  Result Value Ref Range   Glucose-Capillary 242 (H) 70 - 99 mg/dL  CBG monitoring, ED     Status: Abnormal   Collection Time: 03/06/18  6:17  PM  Result Value Ref Range   Glucose-Capillary 204 (H) 70 - 99 mg/dL   Ct Head Wo Contrast  Result  Date: 03/06/2018 CLINICAL DATA:  Confusion, altered mental status. EXAM: CT HEAD WITHOUT CONTRAST TECHNIQUE: Contiguous axial images were obtained from the base of the skull through the vertex without intravenous contrast. COMPARISON:  01/20/2004 FINDINGS: Brain: There is atrophy and chronic small vessel disease changes. Vascular: No acute intracranial abnormality. Specifically, no hemorrhage, hydrocephalus, mass lesion, acute infarction, or significant intracranial injury. Skull: No acute calvarial abnormality. Sinuses/Orbits: Visualized paranasal sinuses and mastoids clear. Orbital soft tissues unremarkable. Other: None IMPRESSION: Atrophy, chronic microvascular disease. No acute intracranial abnormality. Electronically Signed   By: Rolm Baptise M.D.   On: 03/06/2018 12:51    Pending Labs Unresulted Labs (From admission, onward)    Start     Ordered   03/07/18 6063  Basic metabolic panel  Tomorrow morning,   R     03/06/18 1614   03/07/18 0500  CBC  Tomorrow morning,   R     03/06/18 1614          Vitals/Pain Today's Vitals   03/06/18 1930 03/06/18 1957 03/06/18 2030 03/06/18 2100  BP: 93/65  (!) 116/55 121/62  Pulse: 94  93 96  Resp: 18  16 19   Temp:      TempSrc:      SpO2: 100%  100% 100%  Weight:      Height:      PainSc:  0-No pain      Isolation Precautions No active isolations  Medications Medications  0.9 %  sodium chloride infusion ( Intravenous Rate/Dose Verify 03/06/18 1958)  allopurinol (ZYLOPRIM) tablet 100 mg (has no administration in time range)  aspirin chewable tablet 81 mg (has no administration in time range)  oxybutynin (DITROPAN) tablet 5 mg (has no administration in time range)  acetaminophen (TYLENOL) tablet 650 mg (has no administration in time range)    Or  acetaminophen (TYLENOL) suppository 650 mg (has no administration in time range)  ondansetron (ZOFRAN) tablet 4 mg (has no administration in time range)    Or  ondansetron (ZOFRAN) injection 4  mg (has no administration in time range)  insulin aspart (novoLOG) injection 0-9 Units (0 Units Subcutaneous Not Given 03/06/18 1824)  insulin aspart (novoLOG) injection 0-5 Units (has no administration in time range)  sodium bicarbonate tablet 650 mg (has no administration in time range)  dextrose 50 % solution (25 mLs  Given 03/06/18 0852)  sodium chloride 0.9 % bolus 500 mL (0 mLs Intravenous Stopped 03/06/18 1048)  dextrose 50 % solution (75 mLs  Given 03/06/18 0945)  albuterol (PROVENTIL) (2.5 MG/3ML) 0.083% nebulizer solution 10 mg (10 mg Nebulization Given 03/06/18 1103)  sodium zirconium cyclosilicate (LOKELMA) packet 10 g (10 g Oral Given 03/06/18 1215)  acetaminophen (TYLENOL) tablet 650 mg (650 mg Oral Given 03/06/18 1047)  calcium gluconate 1 g/ 50 mL sodium chloride IVPB (0 g Intravenous Stopped 03/06/18 1547)  sodium polystyrene (KAYEXALATE) 15 GM/60ML suspension 30 g (30 g Oral Given 03/06/18 1821)    Mobility walks with device

## 2018-03-07 ENCOUNTER — Other Ambulatory Visit: Payer: Self-pay

## 2018-03-07 ENCOUNTER — Inpatient Hospital Stay (HOSPITAL_COMMUNITY): Payer: PPO

## 2018-03-07 DIAGNOSIS — R634 Abnormal weight loss: Secondary | ICD-10-CM

## 2018-03-07 DIAGNOSIS — E871 Hypo-osmolality and hyponatremia: Secondary | ICD-10-CM

## 2018-03-07 DIAGNOSIS — N179 Acute kidney failure, unspecified: Principal | ICD-10-CM

## 2018-03-07 DIAGNOSIS — I10 Essential (primary) hypertension: Secondary | ICD-10-CM

## 2018-03-07 DIAGNOSIS — R627 Adult failure to thrive: Secondary | ICD-10-CM

## 2018-03-07 DIAGNOSIS — Z515 Encounter for palliative care: Secondary | ICD-10-CM

## 2018-03-07 DIAGNOSIS — T17908A Unspecified foreign body in respiratory tract, part unspecified causing other injury, initial encounter: Secondary | ICD-10-CM

## 2018-03-07 DIAGNOSIS — E119 Type 2 diabetes mellitus without complications: Secondary | ICD-10-CM

## 2018-03-07 DIAGNOSIS — T17908D Unspecified foreign body in respiratory tract, part unspecified causing other injury, subsequent encounter: Secondary | ICD-10-CM

## 2018-03-07 DIAGNOSIS — E861 Hypovolemia: Secondary | ICD-10-CM

## 2018-03-07 DIAGNOSIS — I9589 Other hypotension: Secondary | ICD-10-CM

## 2018-03-07 DIAGNOSIS — E43 Unspecified severe protein-calorie malnutrition: Secondary | ICD-10-CM

## 2018-03-07 DIAGNOSIS — E875 Hyperkalemia: Secondary | ICD-10-CM

## 2018-03-07 LAB — BASIC METABOLIC PANEL
Anion gap: 8 (ref 5–15)
BUN: 87 mg/dL — ABNORMAL HIGH (ref 8–23)
CO2: 18 mmol/L — AB (ref 22–32)
Calcium: 8.9 mg/dL (ref 8.9–10.3)
Chloride: 113 mmol/L — ABNORMAL HIGH (ref 98–111)
Creatinine, Ser: 2.11 mg/dL — ABNORMAL HIGH (ref 0.44–1.00)
GFR calc Af Amer: 23 mL/min — ABNORMAL LOW (ref >60)
GFR calc non Af Amer: 20 mL/min — ABNORMAL LOW (ref >60)
Glucose, Bld: 132 mg/dL — ABNORMAL HIGH (ref 70–99)
Potassium: 5.3 mmol/L — ABNORMAL HIGH (ref 3.5–5.1)
Sodium: 139 mmol/L (ref 135–145)

## 2018-03-07 LAB — CBC
HCT: 28.6 % — ABNORMAL LOW (ref 36.0–46.0)
Hemoglobin: 9.2 g/dL — ABNORMAL LOW (ref 12.0–15.0)
MCH: 33.1 pg (ref 26.0–34.0)
MCHC: 32.2 g/dL (ref 30.0–36.0)
MCV: 102.9 fL — ABNORMAL HIGH (ref 80.0–100.0)
Platelets: 111 10*3/uL — ABNORMAL LOW (ref 150–400)
RBC: 2.78 MIL/uL — ABNORMAL LOW (ref 3.87–5.11)
RDW: 14.7 % (ref 11.5–15.5)
WBC: 9.7 10*3/uL (ref 4.0–10.5)
nRBC: 0 % (ref 0.0–0.2)

## 2018-03-07 LAB — GLUCOSE, CAPILLARY
Glucose-Capillary: 111 mg/dL — ABNORMAL HIGH (ref 70–99)
Glucose-Capillary: 172 mg/dL — ABNORMAL HIGH (ref 70–99)
Glucose-Capillary: 108 mg/dL — ABNORMAL HIGH (ref 70–99)
Glucose-Capillary: 147 mg/dL — ABNORMAL HIGH (ref 70–99)

## 2018-03-07 LAB — RETICULOCYTES
Immature Retic Fract: 8.3 % (ref 2.3–15.9)
RBC.: 2.86 MIL/uL — ABNORMAL LOW (ref 3.87–5.11)
Retic Count, Absolute: 26.9 10*3/uL (ref 19.0–186.0)
Retic Ct Pct: 0.9 % (ref 0.4–3.1)

## 2018-03-07 LAB — IRON AND TIBC
Iron: 11 ug/dL — ABNORMAL LOW (ref 28–170)
Saturation Ratios: 5 % — ABNORMAL LOW (ref 10.4–31.8)
TIBC: 242 ug/dL — ABNORMAL LOW (ref 250–450)
UIBC: 231 ug/dL

## 2018-03-07 LAB — VITAMIN B12: Vitamin B-12: 464 pg/mL (ref 180–914)

## 2018-03-07 LAB — FERRITIN: Ferritin: 264 ng/mL (ref 11–307)

## 2018-03-07 LAB — FOLATE: Folate: 8.2 ng/mL (ref >5.9)

## 2018-03-07 MED ORDER — LEVALBUTEROL HCL 0.63 MG/3ML IN NEBU
0.6300 mg | INHALATION_SOLUTION | Freq: Two times a day (BID) | RESPIRATORY_TRACT | Status: DC
  Administered 2018-03-08 – 2018-03-11 (×7): 0.63 mg via RESPIRATORY_TRACT
  Filled 2018-03-07 (×7): qty 3

## 2018-03-07 MED ORDER — LEVALBUTEROL HCL 0.63 MG/3ML IN NEBU
0.6300 mg | INHALATION_SOLUTION | Freq: Three times a day (TID) | RESPIRATORY_TRACT | Status: DC
  Administered 2018-03-07 (×2): 0.63 mg via RESPIRATORY_TRACT
  Filled 2018-03-07 (×2): qty 3

## 2018-03-07 MED ORDER — LORAZEPAM 2 MG/ML IJ SOLN
1.0000 mg | Freq: Once | INTRAMUSCULAR | Status: AC
  Administered 2018-03-07: 1 mg via INTRAVENOUS
  Filled 2018-03-07: qty 1

## 2018-03-07 MED ORDER — SODIUM CHLORIDE 0.9 % IV SOLN
3.0000 g | INTRAVENOUS | Status: DC
  Administered 2018-03-07 – 2018-03-09 (×3): 3 g via INTRAVENOUS
  Filled 2018-03-07 (×4): qty 3

## 2018-03-07 MED ORDER — SODIUM POLYSTYRENE SULFONATE 15 GM/60ML PO SUSP
30.0000 g | Freq: Once | ORAL | Status: AC
  Administered 2018-03-07: 30 g via ORAL
  Filled 2018-03-07: qty 120

## 2018-03-07 MED ORDER — FLUTICASONE PROPIONATE 50 MCG/ACT NA SUSP
2.0000 | Freq: Every day | NASAL | Status: DC
  Administered 2018-03-07 – 2018-03-11 (×5): 2 via NASAL
  Filled 2018-03-07: qty 16

## 2018-03-07 MED ORDER — GUAIFENESIN ER 600 MG PO TB12
600.0000 mg | ORAL_TABLET | Freq: Two times a day (BID) | ORAL | Status: DC
  Administered 2018-03-07 – 2018-03-11 (×9): 600 mg via ORAL
  Filled 2018-03-07 (×9): qty 1

## 2018-03-07 MED ORDER — SENNA 8.6 MG PO TABS
2.0000 | ORAL_TABLET | Freq: Every day | ORAL | Status: DC
  Administered 2018-03-08 – 2018-03-10 (×3): 17.2 mg via ORAL
  Filled 2018-03-07 (×3): qty 2

## 2018-03-07 MED ORDER — LORATADINE 10 MG PO TABS
10.0000 mg | ORAL_TABLET | Freq: Every day | ORAL | Status: DC
  Administered 2018-03-07 – 2018-03-11 (×5): 10 mg via ORAL
  Filled 2018-03-07 (×5): qty 1

## 2018-03-07 NOTE — Progress Notes (Signed)
Attempted echo at 1:30, Bernie had to go to [facility]. I needed to do 2:00 outpt. Attempted patient at 3:00pm. Patient wanting to sit in chair. Quillian Quince, RN asked could do echo later. I said we could get it tomorrow. Family okay with this.

## 2018-03-07 NOTE — Consult Note (Signed)
Consultation Note Date: 03/07/2018   Patient Name: Bridget Rowland  DOB: [birth year]  MRN: [id number]  Age / Sex: 84 y.o., female  PCP: Iona Beard, MD Referring Physician: Eugenie Filler, MD  Reason for Consultation: Establishing goals of care, Hospice Evaluation, Non pain symptom management and Psychosocial/spiritual support  HPI/Patient Profile: 84 y.o. female  with past medical history of complete heart block s/p pace maker placement, DM, CKD 3-4 with baseline creatinine of 1.9, and dysphagia secondary to presbyesophagus who was admitted on 03/06/2018 with altered mental status, acute on chronic kidney disease and hypotension. BP medications were held and she received fluids overnight.  CXR this morning shows LLL infiltrate vs atelectasis.  Clinical Assessment and Goals of Care:  I have reviewed medical records including EPIC notes, labs and imaging, received report from the care team, assessed the patient and then met at the bedside along with her daughter and son  to discuss diagnosis prognosis, GOC, EOL wishes, disposition and options.  I introduced Palliative Medicine as specialized medical care for people living with serious illness. It focuses on providing relief from the symptoms and stress of a serious illness. The goal is to improve quality of life for both the patient and the family.  We discussed a brief life review of the patient.  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  She raised to children.  Her daughter now lives in [place] and her son lives in [place].  She lost her husband 6 years ago. She lives alone although her daughter has been staying with her 3 nights a week.  Since retirement she has remained active and loves to dance.  She smiles and tells me she has two boyfriends -  one of whom is 82.  (Her daughter nods and tells me they have been together for 6 years)  As far as functional and nutritional status, Bridget Rowland tells me things have slowed down at home in the last 6 months and she is bored.  She can't get around as well as she used to (she uses a cane).  2 weeks ago she and her boyfriend went to [place] to dance and she could only dance with him once.  Her appetite has decreased significantly.  Her PCP prescribed Eldertonic - and it gave her a boost.  She is supposed to drink 3 ensures per day, but manages to get 1 of them down.  Bridget Rowland reports she is chronically constipated and usually has 1 bowel movement per week.  We discussed attempting to increase that to a bowel movement every other day - this may also help her appetite.  We discussed her current illness and what it means in the larger context of her on-going co-morbidities.  Natural disease trajectory and expectations at EOL were discussed.  Bridget Rowland tells me she already has her funeral all planned. She is focused on quality of life rather than quantity.  Her son in the room tells me he wants to ensure she is comfortable.  I attempted to elicit values and goals of care important to the patient.  The difference between aggressive medical intervention and comfort care was considered in light of the patient's goals of care. Advanced directives, concepts specific to code status, artifical feeding and hydration, and rehospitalization were considered and discussed.  Bridget Rowland has already declared "DNR" status.  Hospice and Palliative Care services outpatient were explained and offered.  She is amenable to hospice at discharge.  During our meeting Bridget Rowland was having difficulty with shortness of breath.  She tells me she was up all night last night trying to breathe.  She states she has been having difficulty with this for the last 3 months and is scheduled to have an echo-cardiogram next week with Dr. Lovena Le (EP).  The breathing treatment she  had yesterday was helpful.  She is normally on HCTZ at home but this is being held due to low BP.  Questions and concerns were addressed.  Hard Choices booklet left for review. The family was encouraged to call with questions or concerns.    Primary Decision Maker:  PATIENT (she is very sharp).  Son and daughter would be default HCPOA if necessary.    SUMMARY OF RECOMMENDATIONS     Discussed with Dr. Grandville Silos.  Will order echocardiogram in house to check for heart failure as she is having SOB.  Will request breathing treatments.  Dr. Grandville Silos will assess need for antibiotics and further treatment of SOB.  Given recent esophagram testing 12/10 I'm concerned for aspiration.  Perhaps SLP diet recommendations would be helpful.  Patient politely declines appetite stimulants.  Constipation - Senna - will give 2 tabs tonight then 1 tablet nightly (hold for diarrhea)  Patient will likely be eligible for hospice services in her home at the time of discharge   Code Status/Advance Care Planning:  DNR.   Symptom Management:   Breathing treatments  Senna QHS. - this may help her bowels and her appetite.  Additional Recommendations (Limitations, Scope, Preferences):  Treat reversible illness with minimally invasive measures.  Palliative Prophylaxis:   Aspiration  Prognosis: less than 6 months given acute on chronic stg 4 kidney failure, aspiration, weight loss, hypotension, and the desire to avoid invasive/aggressive therapies and focus on comfort.   Discharge Planning: To Be Determined  Likely home with hospice.      Primary Diagnoses: Present on Admission: . Essential hypertension . Weight loss, unintentional   I have reviewed the medical record, interviewed the patient and family, and examined the patient. The following aspects are pertinent.  Past Medical History:  Diagnosis Date  . CAD (coronary artery disease)   . Diabetes mellitus   . Ecchymosis 11/01/2014    Has bruising left inner labia  . Hyperlipidemia   . Hypertension   . Other specified cardiac dysrhythmias(427.89)    Social History   Socioeconomic History  . Marital status: Married    Spouse name: Not on file  . Number of children: Not on file  . Years of education: Not on file  . Highest education level: Not on file  Occupational History  . Occupation: Retired    Fish farm manager: RETIRED  Social Needs  . Financial resource strain: Not on file  . Food insecurity:    Worry: Not on file    Inability: Not on file  . Transportation needs:    Medical: Not on file    Non-medical: Not on  file  Tobacco Use  . Smoking status: Never Smoker  . Smokeless tobacco: Never Used  Substance and Sexual Activity  . Alcohol use: Yes    Alcohol/week: 0.0 standard drinks    Comment: once a month  . Drug use: No  . Sexual activity: Yes    Birth control/protection: Post-menopausal  Lifestyle  . Physical activity:    Days per week: Not on file    Minutes per session: Not on file  . Stress: Not on file  Relationships  . Social connections:    Talks on phone: Not on file    Gets together: Not on file    Attends religious service: Not on file    Active member of club or organization: Not on file    Attends meetings of clubs or organizations: Not on file    Relationship status: Not on file  Other Topics Concern  . Not on file  Social History Narrative   No regular exercise    Family History  Problem Relation Age of Onset  . Heart disease Father   . Other Sister        stomach problems  . Cancer Daughter        in remission  . Cancer Maternal Grandfather   . COPD Sister   . Congestive Heart Failure Sister    Scheduled Meds: . allopurinol  100 mg Oral Daily  . aspirin  81 mg Oral q morning - 10a  . feeding supplement (ENSURE ENLIVE)  237 mL Oral BID BM  . insulin aspart  0-5 Units Subcutaneous QHS  . insulin aspart  0-9 Units Subcutaneous TID WC  . levalbuterol  0.63 mg Nebulization  Q8H  . oxybutynin  5 mg Oral Daily  . sodium bicarbonate  650 mg Oral BID   Continuous Infusions: . sodium chloride 100 mL/hr at 03/07/18 0907   PRN Meds:.acetaminophen **OR** acetaminophen, ondansetron **OR** ondansetron (ZOFRAN) IV Allergies  Allergen Reactions  . Actos [Pioglitazone Hydrochloride]     Reaction:unknown   Review of Systems complains of SOB, decreased appetite, constipation.    Physical Exam  Very pleasant, frail, elderly female, alert, orientated, cognitively intact. CV rrr resp mildly increased work of breathing at rest.  SOB when speaking. Abdomen soft, with some masses (likely stool) noted on the right, NT, decreased bowel sounds.   Vital Signs: BP (!) 114/52 (BP Location: Right Leg)   Pulse 92   Temp 97.9 F (36.6 C) (Oral)   Resp 16   Ht '5\' 3"'$  (1.6 m)   Wt 43.4 kg   SpO2 100%   BMI 16.95 kg/m  Pain Scale: 0-10   Pain Score: 0-No pain   SpO2: SpO2: 100 % O2 Device:SpO2: 100 % O2 Flow Rate: .O2 Flow Rate (L/min): 2 L/min  IO: Intake/output summary:   Intake/Output Summary (Last 24 hours) at 03/07/2018 1202 Last data filed at 03/07/2018 0132 Gross per 24 hour  Intake 1400.16 ml  Output -  Net 1400.16 ml    LBM: Last BM Date: 03/06/18 Baseline Weight: Weight: 42 kg Most recent weight: Weight: 43.4 kg     Palliative Assessment/Data: 50%     Time In: 11:00 Time Out: 12:10 Time Total: 70 min. Greater than 50%  of this time was spent counseling and coordinating care related to the above assessment and plan.  Signed by: Florentina Jenny, PA-C Palliative Medicine Pager: [phone]  Please contact Palliative Medicine Team phone at [phone] for questions and concerns.  For individual provider: See  Amion

## 2018-03-07 NOTE — Progress Notes (Addendum)
Initial Nutrition Assessment  DOCUMENTATION CODES:  Severe malnutrition in context of chronic illness, Underweight  INTERVENTION:  Continue Ensure Enlive po BID, each supplement provides 350 kcal and 20 grams of protein  +Pudding to trays  Monitor outcome of Palliative Care Consult.   NUTRITION DIAGNOSIS:  Inadequate oral intake related to frank anorexia as evidenced by severe loss of muscle/fat reserves  GOAL:  Patient will meet greater than or equal to 90% of their needs  MONITOR:  PO intake, Supplement acceptance, Labs, Weight trends, I & O's, Goals of care  REASON FOR ASSESSMENT:  Malnutrition Screening Tool    ASSESSMENT:  84 y/o female PMHx CAD, Pacemaker, DM2, HLD/HTN, CKD. Brought to ED by family after she was found at home weak and confused. Per family, pt has been declining over past 6 months. Has had a poor appetite and progressive weakness. Work up revealed AKI with Creatine 2.7 and K 6.3. Admitted for management.   Spoke w/ pt and her two children at bedside. Pt describes a complete lack of desire to eat. There is no associated n/v or other factors influencing her intake. Daughter at bedside says recently pts daily intake has consisted of 1 Boost and "bites and sips". While hard to discern, it does not sounds like she was consuming enough fluids to stay hydrated. She had been taking a mvi, but stopped ~6 months ago d/t trouble swallowing pills. Surprisingly, pt says that despite her minimal intake/weakness, she still was dancing just 2 weeks ago, a favorite hobby of hers.   RD noted in chart that pt had a "swallow test" done last week, which apparently showed some dysphagia. Per a RN note, family reported pt "does not do well with water". When asked about this, family says the recent swallow test showed that pt "ocassionally aspirates". However, the person who conducted the swallow evaluation did not recommend any diet downgrade or altered consistencies. Currently, pt is on  regular consistencies.  Family says she mainly struggles with pills. RD asked if a diet downgrade would be helpful and they all said no.  Pt has intermittent diarrhea and constipation. Bowel movements are infrequent, but family believes this is due to minimal intake.   Wt wise, the patient was admitted at a weight of 92.8 lbs. She has been rehydrated to 95.7 lbs. Per chart, she was 110 at OP appt in June, though RD was unable to determine if this wt was measured on scale as opposed to reported/estimated. Pt does believe she was 110 lbs in June.   Today, RN notes pt had half an Ensure and bites/sips for breakfast. RD postulated her lack of appetite has been d/t her declining kidney function. Family says they had heard this from MD as well. If this is case, intake may improve with hydration. However currently, pt has no desire to eat. She could not name a single food she wanted. Family notes she likes pudding. Will add to trays. RD recommended Boost breeze as it would not be as satiating. Pt says she has no problems with the Ensure. RD encouraged pt to take sips as she can.    Labs: k: 6.7->5.3, Bgs averaging 170-240, BUN/creat:100/2.7 -> 87/2.11 Meds: Ensure Enlive BID, insulin, bicarb, IVF  Recent Labs  Lab 03/04/18 1040 03/06/18 0935 03/06/18 1347 03/07/18 0434  NA 137 132*  --  139  K 5.8* 6.7* 6.3* 5.3*  CL 107 105  --  113*  CO2 21* 18*  --  18*  BUN 71*  100*  --  87*  CREATININE 1.97* 2.70*  --  2.11*  CALCIUM 10.1 9.8  --  8.9  GLUCOSE 231* 171*  --  132*   NUTRITION - FOCUSED PHYSICAL EXAM:   Most Recent Value  Orbital Region  Moderate depletion  Upper Arm Region  Severe depletion  Thoracic and Lumbar Region  Severe depletion  Buccal Region  Moderate depletion  Temple Region  Moderate depletion  Clavicle Bone Region  Severe depletion  Clavicle and Acromion Bone Region  Severe depletion  Scapular Bone Region  Unable to assess  Dorsal Hand  Moderate depletion  Patellar  Region  Severe depletion  Anterior Thigh Region  Severe depletion  Posterior Calf Region  Severe depletion  Edema (RD Assessment)  None  Hair  Reviewed  Eyes  Reviewed  Mouth  Reviewed  Skin  Reviewed  Nails  Reviewed     Diet Order:   Diet Order            Diet Heart Room service appropriate? Yes; Fluid consistency: Thin  Diet effective now             EDUCATION NEEDS:  No education needs have been identified at this time  Skin:  Skin Assessment: Reviewed RN Assessment  Last BM:  12/26  Height:  Ht Readings from Last 1 Encounters:  03/06/18 5\' 3"  (1.6 m)   Weight:  Wt Readings from Last 1 Encounters:  03/06/18 43.4 kg   Wt Readings from Last 10 Encounters:  03/06/18 43.4 kg  02/24/18 41.8 kg  09/05/17 49.9 kg  07/09/16 53.2 kg  06/22/15 54.4 kg  11/01/14 54.4 kg  06/18/14 54.5 kg  05/29/13 55.8 kg  05/12/12 57.2 kg  05/15/11 54 kg   Ideal Body Weight:  52.27 kg  BMI:  Body mass index is 16.95 kg/m.  Estimated Nutritional Needs:  Kcal:  1500-1650 (34-38 kcal/kg bw) Protein:  65-75g Pro (1.5-1.7 g/kg bw) Fluid:  1.1-1.1.3 L fluid (25-30 ml/kg bw)  Burtis Junes RD, LDN, CNSC Clinical Nutrition Available Tues-Sat via Pager: [phone] 03/07/2018 12:45 PM

## 2018-03-07 NOTE — Progress Notes (Signed)
PROGRESS NOTE    Bridget Rowland  MVH:[id number] DOB: Oct 21, 1925 DOA: 03/06/2018 PCP: Iona Beard, MD    Brief Narrative:  HPI by Dr. Louie Bun is a 84 y.o. female with medical history significant for CAD, diabetes type 2, dyslipidemia, hypertension, and CKD-likely stage IV with prior creatinine known to be 1.97 who presented to the ED for evaluation of altered mental status after her son found her at home quite weak and confused.  According to the daughter at the bedside, she has been coming to stay with her mother several times per week as she has been more generally weak and has had a poor appetite.  She states that this is been going on for the last 6 weeks.  She was actually doing quite well yesterday at a Christmas party that the family had and did not expect to find her in such a condition today.  Patient states that she has been having worsening urine output as well and has been trying to take her home medications as otherwise prescribed.  She has been losing weight as a result of her poor appetite but denies any nausea, vomiting, diarrhea, chest pain, or shortness of breath.   ED Course: Vital signs demonstrate some low blood pressure readings that have been confirmed manually.  She is noted to have a creatinine of 2.7 with a baseline just 2 days ago of 1.9.  BUN is 100 and WBC count is 12.6, sodium 132, potassium 6.3.  Hemoglobin is 11.3 with platelet count of 127.  CT of the head was performed with no acute findings aside from some atrophy.  Patient has been given some calcium gluconate as well as Lokelma and has had no significant findings on telemetry.  She has been started on normal saline at 125 cc/h and has received a 500 mL bolus.  Family members at bedside understand that she has an overall poor prognosis with little chance for survival past 6 months.   Assessment & Plan:   Principal Problem:   Acute metabolic encephalopathy Active Problems:   Essential hypertension  Weight loss, unintentional   AKI (acute kidney injury) ([place])   Failure to thrive in adult   Type 2 diabetes mellitus without complication (HCC)   Hyperkalemia   Hyponatremia   Aspiration into airway   Palliative care encounter   Protein-calorie malnutrition, severe   Hypotension due to hypovolemia   FTT (failure to thrive) in adult  1 acute metabolic encephalopathy Felt likely secondary to acute kidney injury, dehydration and probable aspiration pneumonia versus community-acquired pneumonia.  Patient on admission was noted to be hypotensive hydrate with IV fluids with improvement with blood pressure.  Renal function trending down.  Chest x-ray done consistent with a left lower lobe infiltrate.  Decrease IV fluids to 75 cc/h.  Check a sputum Gram stain and culture.  Check a urine Legionella antigen.  Check a urine pneumococcus antigen.  Place empirically on IV Unasyn.  Supportive care.  Follow.  2.  Acute renal failure on chronic kidney disease stage IV Likely secondary to a prerenal azotemia.  Patient noted to be hypotensive on admission.  Patient also noted to be on ARB and HCTZ on admission.  Last creatinine noted of 1.97 on 03/04/2018.  No prior labs to compare to.  ARB and HCTZ on hold and will likely not resume on discharge.  Renal function trending down.  Decrease IV fluids to normal saline at 75 cc/h.  Follow.  3.  Hyperkalemia Likely  secondary to worsening renal function in the setting of ARB.  ARB and diuretics on hold and likely will not resume ARB on discharge.  Hyperkalemia improving potassium level currently at 5.3 from 6.7.  We will give Kayexalate 30 g p.o. x1.  Follow.  4.  Hyponatremia Likely secondary to hypovolemic hyponatremia.  Improved with hydration.  Follow.  5.  Metabolic acidosis Likely secondary to worsening renal function.  Continue  bicarb tablets.  6.  Community-acquired pneumonia versus aspiration pneumonia Noted on chest x-ray.  Patient had presented with  hypotension and acute metabolic encephalopathy.  Check a sputum Gram stain and culture.  Check urine Legionella antigen.  Check a urine pneumococcus antigen.  Place on IV Unasyn, Flonase, Claritin, Mucinex.  Supportive care.  7.  Severe protein calorie malnutrition Nutritional supplementation.  8.  Hypertension Hold antihypertensive medications secondary to hypotension on admission.  9.  Hypotension Patient noted to be hypotensive on admission.  Etiology could be likely secondary to hypovolemia as blood pressure responded to IV fluids.  Chest x-ray consistent with probable pneumonia.  Patient started on IV Unasyn.  Urinalysis negative for UTI.  Continue IV fluids.  Supportive care.  10.  Diabetes mellitus type 2 Continue sliding scale insulin.  11.  Failure to thrive Patient noted to have some weight loss over the past several months.  Patient with some worsening shortness of breath has been ongoing chronically for the past few months.  Patient with severe protein calorie malnutrition.  Patient with a poor prognosis.  Palliative care consultation.  Patient likely home with hospice.    DVT prophylaxis: SCD Code Status: Full Family Communication: Updated patient and son at bedside. Disposition Plan: Likely home with hospice when clinically improved and medically stable hopefully in the next 48 hours.   Consultants:   Palliative care  Procedures:   2D echo pending 03/07/2018  Chest x-ray 03/07/2018  CTA 03/06/2018  Antimicrobials:  IV Unasyn 03/07/2018   Subjective: Sitting up in bed.  Family at bedside.  Patient alert following commands answering questions appropriately.  Patient with complaints of shortness of breath which she states has been ongoing for several months now.  Denies any chest pain.  Patient alert to self place and time.  Knows who the president is.  Objective: Vitals:   03/07/18 0536 03/07/18 1349 03/07/18 1351 03/07/18 1439  BP: (!) 114/52  (!) 110/59     Pulse: 92 (!) 107 (!) 107   Resp: 16 20    Temp: 97.9 F (36.6 C) 97.9 F (36.6 C)    TempSrc: Oral Oral    SpO2: 100% 94%  95%  Weight:      Height:        Intake/Output Summary (Last 24 hours) at 03/07/2018 1753 Last data filed at 03/07/2018 1658 Gross per 24 hour  Intake 2890.52 ml  Output -  Net 2890.52 ml   Filed Weights   03/06/18 0838 03/06/18 2149  Weight: 42 kg 43.4 kg    Examination:  General exam: Appears calm and comfortable  Respiratory system: Some scattered coarse breath sounds left base otherwise clear.  No wheezing, no crackles.normal respiratory effort.   Cardiovascular system: S1 & S2 heard, RRR. No JVD, murmurs, rubs, gallops or clicks. No pedal edema. Gastrointestinal system: Abdomen is nondistended, soft and nontender. No organomegaly or masses felt. Normal bowel sounds heard. Central nervous system: Alert and oriented. No focal neurological deficits. Extremities: Symmetric 5 x 5 power. Skin: No rashes, lesions or ulcers Psychiatry: Judgement  and insight appear normal. Mood & affect appropriate.     Data Reviewed: I have personally reviewed following labs and imaging studies  CBC: Recent Labs  Lab 03/06/18 0935 03/07/18 0434  WBC 12.6* 9.7  NEUTROABS 11.1*  --   HGB 11.3* 9.2*  HCT 35.4* 28.6*  MCV 102.3* 102.9*  PLT 127* 354*   Basic Metabolic Panel: Recent Labs  Lab 03/04/18 1040 03/06/18 0935 03/06/18 1347 03/07/18 0434  NA 137 132*  --  139  K 5.8* 6.7* 6.3* 5.3*  CL 107 105  --  113*  CO2 21* 18*  --  18*  GLUCOSE 231* 171*  --  132*  BUN 71* 100*  --  87*  CREATININE 1.97* 2.70*  --  2.11*  CALCIUM 10.1 9.8  --  8.9   GFR: Estimated Creatinine Clearance: 12.1 mL/min (A) (by C-G formula based on SCr of 2.11 mg/dL (H)). Liver Function Tests: No results for input(s): AST, ALT, ALKPHOS, BILITOT, PROT, ALBUMIN in the last 168 hours. No results for input(s): LIPASE, AMYLASE in the last 168 hours. No results for input(s):  AMMONIA in the last 168 hours. Coagulation Profile: No results for input(s): INR, PROTIME in the last 168 hours. Cardiac Enzymes: No results for input(s): CKTOTAL, CKMB, CKMBINDEX, TROPONINI in the last 168 hours. BNP (last 3 results) No results for input(s): PROBNP in the last 8760 hours. HbA1C: No results for input(s): HGBA1C in the last 72 hours. CBG: Recent Labs  Lab 03/06/18 1817 03/06/18 2154 03/07/18 0757 03/07/18 1205 03/07/18 1607  GLUCAP 204* 178* 111* 172* 108*   Lipid Profile: No results for input(s): CHOL, HDL, LDLCALC, TRIG, CHOLHDL, LDLDIRECT in the last 72 hours. Thyroid Function Tests: No results for input(s): TSH, T4TOTAL, FREET4, T3FREE, THYROIDAB in the last 72 hours. Anemia Panel: Recent Labs    03/06/18 0933 03/07/18 0434 03/07/18 0923  VITAMINB12 464  --   --   FOLATE  --   --  8.2  FERRITIN 264  --   --   TIBC 242*  --   --   IRON 11*  --   --   RETICCTPCT  --  0.9  --    Sepsis Labs: No results for input(s): PROCALCITON, LATICACIDVEN in the last 168 hours.  No results found for this or any previous visit (from the past 240 hour(s)).       Radiology Studies: Ct Head Wo Contrast  Result Date: 03/06/2018 CLINICAL DATA:  Confusion, altered mental status. EXAM: CT HEAD WITHOUT CONTRAST TECHNIQUE: Contiguous axial images were obtained from the base of the skull through the vertex without intravenous contrast. COMPARISON:  01/20/2004 FINDINGS: Brain: There is atrophy and chronic small vessel disease changes. Vascular: No acute intracranial abnormality. Specifically, no hemorrhage, hydrocephalus, mass lesion, acute infarction, or significant intracranial injury. Skull: No acute calvarial abnormality. Sinuses/Orbits: Visualized paranasal sinuses and mastoids clear. Orbital soft tissues unremarkable. Other: None IMPRESSION: Atrophy, chronic microvascular disease. No acute intracranial abnormality. Electronically Signed   By: Rolm Baptise M.D.   On:  03/06/2018 12:51   Dg Chest Port 1 View  Result Date: 03/07/2018 CLINICAL DATA:  Weakness.  Encephalopathy. EXAM: PORTABLE CHEST 1 VIEW COMPARISON:  Radiographs 05/07/2012. FINDINGS: 1025 hours. Left subclavian pacemaker leads extend into the right atrium and right ventricle. The heart size and mediastinal contours are stable. There is aortic atherosclerosis. There is increased retrocardiac opacity which may reflect atelectasis or early infiltrate. No edema, pneumothorax or significant pleural effusion. The bones appear unchanged.  IMPRESSION: New left lower lobe atelectasis or infiltrate. Radiographic follow up recommended. Electronically Signed   By: Richardean Sale M.D.   On: 03/07/2018 10:42        Scheduled Meds: . allopurinol  100 mg Oral Daily  . aspirin  81 mg Oral q morning - 10a  . feeding supplement (ENSURE ENLIVE)  237 mL Oral BID BM  . fluticasone  2 spray Each Nare Daily  . guaiFENesin  600 mg Oral BID  . insulin aspart  0-5 Units Subcutaneous QHS  . insulin aspart  0-9 Units Subcutaneous TID WC  . levalbuterol  0.63 mg Nebulization Q8H  . loratadine  10 mg Oral Daily  . oxybutynin  5 mg Oral Daily  . senna  2 tablet Oral QHS  . sodium bicarbonate  650 mg Oral BID   Continuous Infusions: . sodium chloride 50 mL/hr at 03/07/18 1658  . ampicillin-sulbactam (UNASYN) IV Stopped (03/07/18 1534)     LOS: 1 day    Time spent: 35 minutes    Irine Seal, MD [facility] Pager [phone]  If 7PM-7AM, please contact night-coverage [email or web address] Password [facility] 03/07/2018, 5:53 PM

## 2018-03-07 NOTE — Progress Notes (Signed)
Pharmacy Antibiotic Note  Bridget Rowland is a 84 y.o. female admitted on 03/06/2018 with aspiration pneumonia.  Pharmacy has been consulted for Unasyn dosing.  Plan: Unasyn 3000 mg IV every 24 hours  Monitor labs, c/s, and patient improvement.  Height: 5\' 3"  (160 cm) Weight: 95 lb 10.9 oz (43.4 kg) IBW/kg (Calculated) : 52.4  Temp (24hrs), Avg:97.7 F (36.5 C), Min:97.5 F (36.4 C), Max:97.9 F (36.6 C)  Recent Labs  Lab 03/04/18 1040 03/06/18 0935 03/07/18 0434  WBC  --  12.6* 9.7  CREATININE 1.97* 2.70* 2.11*    Estimated Creatinine Clearance: 12.1 mL/min (A) (by C-G formula based on SCr of 2.11 mg/dL (H)).    Allergies  Allergen Reactions  . Actos [Pioglitazone Hydrochloride]     Reaction:unknown    Antimicrobials this admission: Unasyn 12/27 >>      Dose adjustments this admission: Unasyn  Microbiology results: 12/27 Sputum: pending    Thank you for allowing pharmacy to be a part of this patient's care.  Ramond Craver 03/07/2018 1:04 PM

## 2018-03-08 ENCOUNTER — Inpatient Hospital Stay (HOSPITAL_COMMUNITY): Payer: PPO

## 2018-03-08 DIAGNOSIS — I10 Essential (primary) hypertension: Secondary | ICD-10-CM

## 2018-03-08 LAB — GLUCOSE, CAPILLARY
GLUCOSE-CAPILLARY: 161 mg/dL — AB (ref 70–99)
Glucose-Capillary: 152 mg/dL — ABNORMAL HIGH (ref 70–99)
Glucose-Capillary: 166 mg/dL — ABNORMAL HIGH (ref 70–99)
Glucose-Capillary: 131 mg/dL — ABNORMAL HIGH (ref 70–99)

## 2018-03-08 LAB — CBC
HCT: 33.2 % — ABNORMAL LOW (ref 36.0–46.0)
Hemoglobin: 10.7 g/dL — ABNORMAL LOW (ref 12.0–15.0)
MCH: 32.9 pg (ref 26.0–34.0)
MCHC: 32.2 g/dL (ref 30.0–36.0)
MCV: 102.2 fL — AB (ref 80.0–100.0)
Platelets: 153 10*3/uL (ref 150–400)
RBC: 3.25 MIL/uL — ABNORMAL LOW (ref 3.87–5.11)
RDW: 14.7 % (ref 11.5–15.5)
WBC: 12.4 10*3/uL — ABNORMAL HIGH (ref 4.0–10.5)
nRBC: 0 % (ref 0.0–0.2)

## 2018-03-08 LAB — BASIC METABOLIC PANEL
Anion gap: 11 (ref 5–15)
BUN: 80 mg/dL — AB (ref 8–23)
CO2: 16 mmol/L — AB (ref 22–32)
Calcium: 8.9 mg/dL (ref 8.9–10.3)
Chloride: 114 mmol/L — ABNORMAL HIGH (ref 98–111)
Creatinine, Ser: 1.91 mg/dL — ABNORMAL HIGH (ref 0.44–1.00)
GFR calc Af Amer: 26 mL/min — ABNORMAL LOW (ref >60)
GFR calc non Af Amer: 23 mL/min — ABNORMAL LOW (ref >60)
Glucose, Bld: 152 mg/dL — ABNORMAL HIGH (ref 70–99)
Potassium: 4.3 mmol/L (ref 3.5–5.1)
Sodium: 141 mmol/L (ref 135–145)

## 2018-03-08 LAB — MAGNESIUM: Magnesium: 1.6 mg/dL — ABNORMAL LOW (ref 1.7–2.4)

## 2018-03-08 MED ORDER — MAGNESIUM SULFATE 4 GM/100ML IV SOLN
4.0000 g | Freq: Once | INTRAVENOUS | Status: AC
  Administered 2018-03-08: 4 g via INTRAVENOUS
  Filled 2018-03-08: qty 100

## 2018-03-08 MED ORDER — SODIUM BICARBONATE 8.4 % IV SOLN
INTRAVENOUS | Status: DC
  Administered 2018-03-08 – 2018-03-09 (×2): via INTRAVENOUS
  Filled 2018-03-08 (×6): qty 150

## 2018-03-08 NOTE — Progress Notes (Signed)
PROGRESS NOTE    Bridget Rowland  PJK:[id number] DOB: 1926-03-01 DOA: 03/06/2018 PCP: Iona Beard, MD    Brief Narrative:  HPI by Dr. Louie Bun is a 84 y.o. female with medical history significant for CAD, diabetes type 2, dyslipidemia, hypertension, and CKD-likely stage IV with prior creatinine known to be 1.97 who presented to the ED for evaluation of altered mental status after her son found her at home quite weak and confused.  According to the daughter at the bedside, she has been coming to stay with her mother several times per week as she has been more generally weak and has had a poor appetite.  She states that this is been going on for the last 6 weeks.  She was actually doing quite well yesterday at a Christmas party that the family had and did not expect to find her in such a condition today.  Patient states that she has been having worsening urine output as well and has been trying to take her home medications as otherwise prescribed.  She has been losing weight as a result of her poor appetite but denies any nausea, vomiting, diarrhea, chest pain, or shortness of breath.   ED Course: Vital signs demonstrate some low blood pressure readings that have been confirmed manually.  She is noted to have a creatinine of 2.7 with a baseline just 2 days ago of 1.9.  BUN is 100 and WBC count is 12.6, sodium 132, potassium 6.3.  Hemoglobin is 11.3 with platelet count of 127.  CT of the head was performed with no acute findings aside from some atrophy.  Patient has been given some calcium gluconate as well as Lokelma and has had no significant findings on telemetry.  She has been started on normal saline at 125 cc/h and has received a 500 mL bolus.  Family members at bedside understand that she has an overall poor prognosis with little chance for survival past 6 months.   Assessment & Plan:   Principal Problem:   Acute metabolic encephalopathy Active Problems:   Essential hypertension  Weight loss, unintentional   AKI (acute kidney injury) ([place])   Failure to thrive in adult   Type 2 diabetes mellitus without complication (HCC)   Hyperkalemia   Hyponatremia   Aspiration into airway   Palliative care encounter   Protein-calorie malnutrition, severe   Hypotension due to hypovolemia   FTT (failure to thrive) in adult  1 acute metabolic encephalopathy Felt likely secondary to acute kidney injury, dehydration and probable aspiration pneumonia versus community-acquired pneumonia.  Patient on admission was noted to be hypotensive hydrate with IV fluids with improvement with blood pressure.  Renal function trending down.  Chest x-ray done consistent with a left lower lobe infiltrate.  Clinical improvement.  Patient likely close to baseline.  Change IV fluids to bicarb drip due to worsening acidosis.  Sputum Gram stain and cultures pending.  Urine Legionella antigen pending.  Urine pneumococcus antigen pending.  Continue empiric IV Unasyn.  Supportive care.  Follow.   2.  Acute renal failure on chronic kidney disease stage IV Likely secondary to a prerenal azotemia.  Patient noted to be hypotensive on admission.  Patient also noted to be on ARB and HCTZ on admission.  Last creatinine noted of 1.97 on 03/04/2018.  No prior labs to compare to.  ARB and HCTZ on hold and will likely not resume on discharge.  Renal function trending down with hydration.  Creatinine now at 1.91.  Continue gentle hydration.  Follow.  3.  Hyperkalemia Likely secondary to worsening renal function in the setting of ARB.  ARB and diuretics on hold and likely will not resume ARB on discharge.  Hyperkalemia improving potassium level currently at 4.3 from 5.3 from 6.7.  Patient given a couple doses of Kayexalate during this hospitalization.  Follow.   4.  Hyponatremia/hypomagnesemia Likely secondary to hypovolemic hyponatremia.  Improved with hydration.  Replete magnesium.  Follow.  5.  Metabolic acidosis Likely  secondary to worsening renal function.  Bicarb trending down.  Discontinued bicarb tablets and placed on a bicarb drip.  Follow.   6.  Community-acquired pneumonia versus aspiration pneumonia Noted on chest x-ray.  Patient had presented with hypotension and acute metabolic encephalopathy.  Sputum Gram stain and cultures pending.  Urine Legionella antigen pending.  Urine pneumococcus antigen pending.  Continue IV Unasyn, Flonase, Claritin, Mucinex.  Supportive care.   7.  Severe protein calorie malnutrition Nutritional supplementation.  8.  Hypertension Antihypertensive medications on hold secondary to hypotension on admission.    9.  Hypotension Patient noted to be hypotensive on admission.  Etiology could be likely secondary to hypovolemia as blood pressure responded to IV fluids.  Chest x-ray consistent with probable pneumonia.  Continue IV Unasyn.  Urinalysis negative for UTI.  Gentle hydration.  Follow.  Supportive care.   10.  Diabetes mellitus type 2 CBG of 152 this morning.  Continue sliding scale insulin.  11.  Failure to thrive Patient noted to have some weight loss over the past several months.  Patient with some worsening shortness of breath has been ongoing chronically for the past few months.  Patient with severe protein calorie malnutrition.  Patient with a poor prognosis.  Palliative care consulted and are following..  Patient likely home with hospice.    DVT prophylaxis: SCD Code Status: Full Family Communication: Updated patient and son and daughter at bedside. Disposition Plan: Likely home with hospice when clinically improved and medically stable.   Consultants:   Palliative care: Florentina Jenny 03/07/2018  Procedures:   2D echo pending  Chest x-ray 03/07/2018  CTA 03/06/2018  Antimicrobials:  IV Unasyn 03/07/2018   Subjective: Sitting up in chair.  Son and daughter at bedside.  Patient states no significant change with chronic worsening  progressive shortness of breath.  Denies any chest pain.  Poor oral intake.  Alert and oriented.   Objective: Vitals:   03/07/18 2131 03/07/18 2133 03/08/18 0537 03/08/18 0749  BP: (!) 104/39  111/77   Pulse: (!) 46  63   Resp: 20     Temp: 98.4 F (36.9 C)  97.7 F (36.5 C)   TempSrc: Oral  Oral   SpO2: 100% 96% 97% 92%  Weight:      Height:        Intake/Output Summary (Last 24 hours) at 03/08/2018 1126 Last data filed at 03/08/2018 0931 Gross per 24 hour  Intake 2753.19 ml  Output -  Net 2753.19 ml   Filed Weights   03/06/18 0838 03/06/18 2149  Weight: 42 kg 43.4 kg    Examination:  General exam: Appears calm and comfortable.  Frail.  Respiratory system: Decreased breath sounds in the bases.  Some scattered coarse breath sounds.  No wheezes.  Normal respiratory effort. Cardiovascular system: Regular rate rhythm no murmurs rubs or gallops.  No JVD.  No lower extremity edema.  Gastrointestinal system: Abdomen is soft, nontender, nondistended, positive bowel sounds.  No rebound.  No guarding.  Central nervous system: Alert and oriented. No focal neurological deficits. Extremities: Symmetric 5 x 5 power. Skin: No rashes, lesions or ulcers Psychiatry: Judgement and insight appear normal. Mood & affect appropriate.     Data Reviewed: I have personally reviewed following labs and imaging studies  CBC: Recent Labs  Lab 03/06/18 0935 03/07/18 0434 03/08/18 0720  WBC 12.6* 9.7 12.4*  NEUTROABS 11.1*  --   --   HGB 11.3* 9.2* 10.7*  HCT 35.4* 28.6* 33.2*  MCV 102.3* 102.9* 102.2*  PLT 127* 111* 998   Basic Metabolic Panel: Recent Labs  Lab 03/04/18 1040 03/06/18 0935 03/06/18 1347 03/07/18 0434 03/08/18 0720  NA 137 132*  --  139 141  K 5.8* 6.7* 6.3* 5.3* 4.3  CL 107 105  --  113* 114*  CO2 21* 18*  --  18* 16*  GLUCOSE 231* 171*  --  132* 152*  BUN 71* 100*  --  87* 80*  CREATININE 1.97* 2.70*  --  2.11* 1.91*  CALCIUM 10.1 9.8  --  8.9 8.9  MG  --    --   --   --  1.6*   GFR: Estimated Creatinine Clearance: 13.4 mL/min (A) (by C-G formula based on SCr of 1.91 mg/dL (H)). Liver Function Tests: No results for input(s): AST, ALT, ALKPHOS, BILITOT, PROT, ALBUMIN in the last 168 hours. No results for input(s): LIPASE, AMYLASE in the last 168 hours. No results for input(s): AMMONIA in the last 168 hours. Coagulation Profile: No results for input(s): INR, PROTIME in the last 168 hours. Cardiac Enzymes: No results for input(s): CKTOTAL, CKMB, CKMBINDEX, TROPONINI in the last 168 hours. BNP (last 3 results) No results for input(s): PROBNP in the last 8760 hours. HbA1C: No results for input(s): HGBA1C in the last 72 hours. CBG: Recent Labs  Lab 03/07/18 0757 03/07/18 1205 03/07/18 1607 03/07/18 2119 03/08/18 0729  GLUCAP 111* 172* 108* 147* 152*   Lipid Profile: No results for input(s): CHOL, HDL, LDLCALC, TRIG, CHOLHDL, LDLDIRECT in the last 72 hours. Thyroid Function Tests: No results for input(s): TSH, T4TOTAL, FREET4, T3FREE, THYROIDAB in the last 72 hours. Anemia Panel: Recent Labs    03/06/18 0933 03/07/18 0434 03/07/18 0923  VITAMINB12 464  --   --   FOLATE  --   --  8.2  FERRITIN 264  --   --   TIBC 242*  --   --   IRON 11*  --   --   RETICCTPCT  --  0.9  --    Sepsis Labs: No results for input(s): PROCALCITON, LATICACIDVEN in the last 168 hours.  No results found for this or any previous visit (from the past 240 hour(s)).       Radiology Studies: Ct Head Wo Contrast  Result Date: 03/06/2018 CLINICAL DATA:  Confusion, altered mental status. EXAM: CT HEAD WITHOUT CONTRAST TECHNIQUE: Contiguous axial images were obtained from the base of the skull through the vertex without intravenous contrast. COMPARISON:  01/20/2004 FINDINGS: Brain: There is atrophy and chronic small vessel disease changes. Vascular: No acute intracranial abnormality. Specifically, no hemorrhage, hydrocephalus, mass lesion, acute  infarction, or significant intracranial injury. Skull: No acute calvarial abnormality. Sinuses/Orbits: Visualized paranasal sinuses and mastoids clear. Orbital soft tissues unremarkable. Other: None IMPRESSION: Atrophy, chronic microvascular disease. No acute intracranial abnormality. Electronically Signed   By: Rolm Baptise M.D.   On: 03/06/2018 12:51   Dg Chest Port 1 View  Result Date: 03/07/2018 CLINICAL DATA:  Weakness.  Encephalopathy.  EXAM: PORTABLE CHEST 1 VIEW COMPARISON:  Radiographs 05/07/2012. FINDINGS: 1025 hours. Left subclavian pacemaker leads extend into the right atrium and right ventricle. The heart size and mediastinal contours are stable. There is aortic atherosclerosis. There is increased retrocardiac opacity which may reflect atelectasis or early infiltrate. No edema, pneumothorax or significant pleural effusion. The bones appear unchanged. IMPRESSION: New left lower lobe atelectasis or infiltrate. Radiographic follow up recommended. Electronically Signed   By: Richardean Sale M.D.   On: 03/07/2018 10:42        Scheduled Meds: . allopurinol  100 mg Oral Daily  . aspirin  81 mg Oral q morning - 10a  . feeding supplement (ENSURE ENLIVE)  237 mL Oral BID BM  . fluticasone  2 spray Each Nare Daily  . guaiFENesin  600 mg Oral BID  . insulin aspart  0-5 Units Subcutaneous QHS  . insulin aspart  0-9 Units Subcutaneous TID WC  . levalbuterol  0.63 mg Nebulization BID  . loratadine  10 mg Oral Daily  . oxybutynin  5 mg Oral Daily  . senna  2 tablet Oral QHS   Continuous Infusions: . ampicillin-sulbactam (UNASYN) IV Stopped (03/07/18 1534)  . magnesium sulfate 1 - 4 g bolus IVPB 4 g (03/08/18 1107)  .  sodium bicarbonate  infusion 1000 mL 75 mL/hr at 03/08/18 1105     LOS: 2 days    Time spent: 35 minutes    Irine Seal, MD [facility] Pager [phone]  If 7PM-7AM, please contact night-coverage [email or web address] Password [facility] 03/08/2018, 11:26 AM

## 2018-03-08 NOTE — Progress Notes (Addendum)
  Echocardiogram 2D Echocardiogram has been performed.  Technically difficult study due to poor patient compliance and body habitus. Patient complained of pain when attempting apical window.   [name] 03/08/2018, 3:55 PM

## 2018-03-09 LAB — GLUCOSE, CAPILLARY
Glucose-Capillary: 204 mg/dL — ABNORMAL HIGH (ref 70–99)
Glucose-Capillary: 207 mg/dL — ABNORMAL HIGH (ref 70–99)
Glucose-Capillary: 144 mg/dL — ABNORMAL HIGH (ref 70–99)
Glucose-Capillary: 236 mg/dL — ABNORMAL HIGH (ref 70–99)

## 2018-03-09 LAB — CBC WITH DIFFERENTIAL/PLATELET
Abs Immature Granulocytes: 0.04 10*3/uL (ref 0.00–0.07)
Basophils Absolute: 0 10*3/uL (ref 0.0–0.1)
Basophils Relative: 0 %
EOS ABS: 0.1 10*3/uL (ref 0.0–0.5)
Eosinophils Relative: 1 %
HCT: 31.9 % — ABNORMAL LOW (ref 36.0–46.0)
Hemoglobin: 10.1 g/dL — ABNORMAL LOW (ref 12.0–15.0)
Immature Granulocytes: 0 %
Lymphocytes Relative: 10 %
Lymphs Abs: 0.9 10*3/uL (ref 0.7–4.0)
MCH: 32.3 pg (ref 26.0–34.0)
MCHC: 31.7 g/dL (ref 30.0–36.0)
MCV: 101.9 fL — ABNORMAL HIGH (ref 80.0–100.0)
Monocytes Absolute: 0.6 10*3/uL (ref 0.1–1.0)
Monocytes Relative: 6 %
Neutro Abs: 7.8 10*3/uL — ABNORMAL HIGH (ref 1.7–7.7)
Neutrophils Relative %: 83 %
Platelets: 180 10*3/uL (ref 150–400)
RBC: 3.13 MIL/uL — ABNORMAL LOW (ref 3.87–5.11)
RDW: 14.9 % (ref 11.5–15.5)
WBC: 9.5 10*3/uL (ref 4.0–10.5)
nRBC: 0 % (ref 0.0–0.2)

## 2018-03-09 LAB — HIV ANTIBODY (ROUTINE TESTING W REFLEX): HIV Screen 4th Generation wRfx: NONREACTIVE

## 2018-03-09 LAB — BASIC METABOLIC PANEL
Anion gap: 11 (ref 5–15)
BUN: 64 mg/dL — ABNORMAL HIGH (ref 8–23)
CO2: 19 mmol/L — ABNORMAL LOW (ref 22–32)
Calcium: 9 mg/dL (ref 8.9–10.3)
Chloride: 109 mmol/L (ref 98–111)
Creatinine, Ser: 1.63 mg/dL — ABNORMAL HIGH (ref 0.44–1.00)
GFR calc Af Amer: 32 mL/min — ABNORMAL LOW (ref >60)
GFR, EST NON AFRICAN AMERICAN: 27 mL/min — AB (ref >60)
GLUCOSE: 208 mg/dL — AB (ref 70–99)
Potassium: 3.7 mmol/L (ref 3.5–5.1)
Sodium: 139 mmol/L (ref 135–145)

## 2018-03-09 LAB — STREP PNEUMONIAE URINARY ANTIGEN: Strep Pneumo Urinary Antigen: NEGATIVE

## 2018-03-09 LAB — MAGNESIUM: Magnesium: 2.9 mg/dL — ABNORMAL HIGH (ref 1.7–2.4)

## 2018-03-09 MED ORDER — SODIUM BICARBONATE 650 MG PO TABS
650.0000 mg | ORAL_TABLET | Freq: Three times a day (TID) | ORAL | Status: DC
  Administered 2018-03-09 – 2018-03-11 (×8): 650 mg via ORAL
  Filled 2018-03-09 (×8): qty 1

## 2018-03-09 MED ORDER — AMOXICILLIN-POT CLAVULANATE 875-125 MG PO TABS
1.0000 | ORAL_TABLET | Freq: Two times a day (BID) | ORAL | Status: DC
  Filled 2018-03-09: qty 1

## 2018-03-09 MED ORDER — RAMELTEON 8 MG PO TABS
8.0000 mg | ORAL_TABLET | Freq: Once | ORAL | Status: AC
  Administered 2018-03-09: 8 mg via ORAL
  Filled 2018-03-09: qty 1

## 2018-03-09 NOTE — Evaluation (Signed)
Clinical/Bedside Swallow Evaluation Patient Details  Name: Bridget Rowland MRN: [id number] Date of Birth: 1925-07-31  Today's Date: 03/09/2018 Time: SLP Start Time (ACUTE ONLY): 1345 SLP Stop Time (ACUTE ONLY): 1415 SLP Time Calculation (min) (ACUTE ONLY): 30 min  Past Medical History:  Past Medical History:  Diagnosis Date  . CAD (coronary artery disease)   . Diabetes mellitus   . Ecchymosis 11/01/2014   Has bruising left inner labia  . Hyperlipidemia   . Hypertension   . Other specified cardiac dysrhythmias(427.89)    Past Surgical History:  Past Surgical History:  Procedure Laterality Date  . APPENDECTOMY    . BACK SURGERY  1995  . CARDIAC CATHETERIZATION    . COLONOSCOPY  08/22/2011   Procedure: COLONOSCOPY;  Surgeon: Daneil Dolin, MD;  Location: [facility];  Service: Endoscopy;  Laterality: N/A;  8:30 AM  . EYE SURGERY    . foot surgery Left   . INSERT / REPLACE / REMOVE PACEMAKER    . PACEMAKER INSERTION  10/30/2007   [employer] Zephyr dual lead pacemaker  . TONSILLECTOMY AND ADENOIDECTOMY     HPI:  Bridget Rowland is a 84 y.o. female with medical history significant for CAD, diabetes type 2, dyslipidemia, hypertension, and CKD-likely stage IV with prior creatinine known to be 1.97 who presented to the ED for evaluation of altered mental status after her son found her at home quite weak and confused.  According to the daughter at the bedside, she has been coming to stay with her mother several times per week as she has been more generally weak and has had a poor appetite.  She states that this is been going on for the last 6 weeks.  She was actually doing quite well yesterday at a Christmas party that the family had and did not expect to find her in such a condition today.  Patient states that she has been having worsening urine output as well and has been trying to take her home medications as otherwise prescribed.  She has been losing weight as a result of her poor appetite but  denies any nausea, vomiting, diarrhea, chest pain, or shortness of breath. BSE requested by Palliative care team due to recent esophagram showing suspected aspiration of water when taking barium tablet.   Assessment / Plan / Recommendation Clinical Impression  Oral motor examination is WFL, Pt is missing her upper dentures and indicates that she prefers softer foods. Pt recalls that she had a barium swallow study a few weeks ago and SLP discussed with her. No definite aspiration was observed, however aspiration of water was strongly suspected when Pt attempting to swallow the barium tablet with water due to significant coughing and SOB. At bedside, Pt presents with audible swallow with thin liquids. She reports some coughing with water with large sips and compensates by taking small sips. No overt s/sx aspiration with NTL. Pt with delayed oral transit with semi solids and mild residuals necessitating liquid wash. SLP explained risks for being on thickened liquids in that it can increase dehydration risk, but also may mitigate aspiration. Pt voiced that she prefers the nectar-thickened liquids with her meals and drinking thin water between meals. Will continue diet as ordered, D3/mech soft and NTL and allow free water between meals and encourage oral care. Recommend PO medications whole in puree or presented with NTL (do not present with thin liquids). We can pursue MBSS tomorrow to objectively evaluate swallow if family/Pt interested.   SLP  Visit Diagnosis: Dysphagia, unspecified (R13.10)     Mild aspiration risk    Diet Recommendation Dysphagia 3 (Mech soft);Nectar-thick liquid;Thin liquid(continue thin water between meals)   Liquid Administration via: Cup Medication Administration: Whole meds with puree(or whole with NTL) Supervision: Patient able to self feed;Intermittent supervision to cue for compensatory strategies Compensations: Slow rate;Small sips/bites;Lingual sweep for clearance of  pocketing Postural Changes: Seated upright at 90 degrees;Remain upright for at least 30 minutes after po intake    Other  Recommendations Oral Care Recommendations: Oral care BID;Staff/trained caregiver to provide oral care Other Recommendations: Order thickener from pharmacy;Clarify dietary restrictions   Follow up Recommendations None      Frequency and Duration min 2x/week  1 week       Prognosis Prognosis for Safe Diet Advancement: Fair Barriers to Reach Goals: (recent barium swallow )      Swallow Study   General Date of Onset: 03/06/18 HPI: Bridget Rowland is a 84 y.o. female with medical history significant for CAD, diabetes type 2, dyslipidemia, hypertension, and CKD-likely stage IV with prior creatinine known to be 1.97 who presented to the ED for evaluation of altered mental status after her son found her at home quite weak and confused.  According to the daughter at the bedside, she has been coming to stay with her mother several times per week as she has been more generally weak and has had a poor appetite.  She states that this is been going on for the last 6 weeks.  She was actually doing quite well yesterday at a Christmas party that the family had and did not expect to find her in such a condition today.  Patient states that she has been having worsening urine output as well and has been trying to take her home medications as otherwise prescribed.  She has been losing weight as a result of her poor appetite but denies any nausea, vomiting, diarrhea, chest pain, or shortness of breath. BSE requested by Palliative care team due to recent esophagram showing suspected aspiration of water when taking barium tablet. Type of Study: Bedside Swallow Evaluation Previous Swallow Assessment: Barium swallow in December with suspected aspiration of water when taking pill Diet Prior to this Study: Dysphagia 3 (soft);Nectar-thick liquids Temperature Spikes Noted: No Respiratory Status: Nasal  cannula History of Recent Intubation: No Behavior/Cognition: Alert;Cooperative;Pleasant mood Oral Cavity Assessment: Within Functional Limits Oral Care Completed by SLP: No Oral Cavity - Dentition: (missing her upper dentures) Vision: Functional for self-feeding Self-Feeding Abilities: Needs assist Patient Positioning: Upright in bed Baseline Vocal Quality: Normal;Low vocal intensity Volitional Cough: Weak Volitional Swallow: Able to elicit    Oral/Motor/Sensory Function Overall Oral Motor/Sensory Function: Within functional limits   Ice Chips Ice chips: Within functional limits Presentation: Spoon   Thin Liquid Thin Liquid: Within functional limits(audible swallow) Presentation: Cup;Straw;Self Fed    Nectar Thick Nectar Thick Liquid: Within functional limits Presentation: Straw   Honey Thick Honey Thick Liquid: Not tested   Puree Puree: Within functional limits Presentation: Spoon   Solid     Solid: Impaired Presentation: Spoon Oral Phase Impairments: Reduced lingual movement/coordination     Thank you,  Genene Churn, [place]  Dylin Ihnen 03/09/2018,2:30 PM

## 2018-03-09 NOTE — Progress Notes (Signed)
PROGRESS NOTE    Bridget Rowland  HOZ:[id number] DOB: 07/22/1925 DOA: 03/06/2018 PCP: Iona Beard, MD    Brief Narrative:  HPI by Dr. Louie Bun is a 84 y.o. female with medical history significant for CAD, diabetes type 2, dyslipidemia, hypertension, and CKD-likely stage IV with prior creatinine known to be 1.97 who presented to the ED for evaluation of altered mental status after her son found her at home quite weak and confused.  According to the daughter at the bedside, she has been coming to stay with her mother several times per week as she has been more generally weak and has had a poor appetite.  She states that this is been going on for the last 6 weeks.  She was actually doing quite well yesterday at a Christmas party that the family had and did not expect to find her in such a condition today.  Patient states that she has been having worsening urine output as well and has been trying to take her home medications as otherwise prescribed.  She has been losing weight as a result of her poor appetite but denies any nausea, vomiting, diarrhea, chest pain, or shortness of breath.   ED Course: Vital signs demonstrate some low blood pressure readings that have been confirmed manually.  She is noted to have a creatinine of 2.7 with a baseline just 2 days ago of 1.9.  BUN is 100 and WBC count is 12.6, sodium 132, potassium 6.3.  Hemoglobin is 11.3 with platelet count of 127.  CT of the head was performed with no acute findings aside from some atrophy.  Patient has been given some calcium gluconate as well as Lokelma and has had no significant findings on telemetry.  She has been started on normal saline at 125 cc/h and has received a 500 mL bolus.  Family members at bedside understand that she has an overall poor prognosis with little chance for survival past 6 months.   Assessment & Plan:   Principal Problem:   Acute metabolic encephalopathy Active Problems:   Essential hypertension  Weight loss, unintentional   AKI (acute kidney injury) ([place])   Failure to thrive in adult   Type 2 diabetes mellitus without complication (HCC)   Hyperkalemia   Hyponatremia   Aspiration into airway   Palliative care encounter   Protein-calorie malnutrition, severe   Hypotension due to hypovolemia   FTT (failure to thrive) in adult  1 acute metabolic encephalopathy Felt likely secondary to acute kidney injury, dehydration and probable aspiration pneumonia versus community-acquired pneumonia.  Patient on admission was noted to be hypotensive and hydrated with IV fluids with improvement with blood pressure.  Renal function trending down.  Chest x-ray done consistent with a left lower lobe infiltrate.  Patient's mentation has improved and likely close to baseline.  Continue empiric antibiotics.  Supportive care.   2.  Acute renal failure on chronic kidney disease stage IV Likely secondary to a prerenal azotemia.  Patient noted to be hypotensive on admission.  Patient also noted to be on ARB and HCTZ on admission.  Last creatinine noted of 1.97 on 03/04/2018.  No prior labs to compare to.  ARB and HCTZ on hold and will likely not resume on discharge.  Renal function trending down with hydration and creatinine currently at 1.63.  Saline lock IV fluids.  Monitor closely for volume overload.  Follow.  3.  Hyperkalemia Likely secondary to worsening renal function in the setting of ARB.  ARB and diuretics on hold and likely will not resume ARB on discharge.  Hyperkalemia improved, potassium level currently at 3.7 from 4.3 from 5.3 from 6.7.  Patient given a couple doses of Kayexalate during this hospitalization.  Follow.   4.  Hyponatremia/hypomagnesemia Likely secondary to hypovolemic hyponatremia.  Improved with hydration.  Magnesium repleted.  Follow.  5.  Metabolic acidosis Likely secondary to worsening renal function.  Bicarb level was trending down and patient placed on a bicarb drip.  Bicarb  levels improving.  Discontinue bicarb drip and placed on oral bicarb tablets.  Follow.   6.  Community-acquired pneumonia versus aspiration pneumonia Noted on chest x-ray.  Patient had presented with hypotension and acute metabolic encephalopathy.  Sputum Gram stain and cultures pending.  Urine Legionella antigen pending.  Urine pneumococcus antigen negative..  Continue IV Flonase, Claritin, Mucinex.  Continue IV Unasyn and transition to oral Augmentin tomorrow.  Supportive care.   7.  Severe protein calorie malnutrition Nutritional supplementation.  8.  Hypertension Continue to hold antihypertensive medications.  Blood pressure is borderline.    9.  Hypotension Patient noted to be hypotensive on admission.  Etiology could be likely secondary to hypovolemia as blood pressure responded to IV fluids.  Chest x-ray consistent with probable pneumonia.  Continue IV Unasyn to oral antibiotics tomorrow..  Urinalysis negative for UTI.  Saline lock IV fluids.  Follow.  Supportive care.   10.  Diabetes mellitus type 2 CBG of 204 this morning.  Continue sliding scale insulin.  11.  Failure to thrive Patient noted to have some weight loss over the past several months.  Patient with some worsening shortness of breath has been ongoing chronically for the past few months.  Patient with severe protein calorie malnutrition.  Patient with a poor prognosis.  Palliative care consulted and are following..  Patient likely home with hospice.    DVT prophylaxis: SCD Code Status: Full Family Communication: Updated patient and son and family at bedside. Disposition Plan: Likely home with hospice when clinically improved and medically stable.   Consultants:   Palliative care: Florentina Jenny 03/07/2018  Procedures:   2D echo pending 03/08/2018  Chest x-ray 03/07/2018  CTA 03/06/2018  Antimicrobials:  IV Unasyn 03/07/2018>>>> 03/09/2018  Augmentin 03/10/2018   Subjective: Patient laying in bed.   Family at bedside.  Patient feels she is slowly getting better than she did on admission.  Still with a progressive shortness of breath but states not as bad as when she presented on admission.  Denies any chest pain.  Poor oral intake.  Awake alert oriented.  Objective: Vitals:   03/08/18 2108 03/08/18 2130 03/09/18 0521 03/09/18 1137  BP:  102/64 (!) 122/54   Pulse:  (!) 124 (!) 59   Resp:  20 20   Temp:  (!) 97.5 F (36.4 C) 97.9 F (36.6 C)   TempSrc:  Oral Oral   SpO2: 96% 93% 98%   Weight:    49.7 kg  Height:        Intake/Output Summary (Last 24 hours) at 03/09/2018 1304 Last data filed at 03/09/2018 1241 Gross per 24 hour  Intake 1335.17 ml  Output 550 ml  Net 785.17 ml   Filed Weights   03/06/18 0838 03/06/18 2149 03/09/18 1137  Weight: 42 kg 43.4 kg 49.7 kg    Examination:  General exam: Appears calm and comfortable.  Frail.  Respiratory system: Decreased breath sounds in the bases otherwise clear.  No wheezes.  No crackles.  Cardiovascular  system: RRR no murmurs rubs or gallops.  No JVD.  No lower extremity edema.  Gastrointestinal system: Abdomen is nontender, nondistended, soft, positive bowel sounds.  No rebound.  No guarding.   Central nervous system: Alert and oriented. No focal neurological deficits.  Moving extremities spontaneously. Extremities: Symmetric 5 x 5 power. Skin: No rashes, lesions or ulcers Psychiatry: Judgement and insight appear normal. Mood & affect appropriate.     Data Reviewed: I have personally reviewed following labs and imaging studies  CBC: Recent Labs  Lab 03/06/18 0935 03/07/18 0434 03/08/18 0720 03/09/18 0558  WBC 12.6* 9.7 12.4* 9.5  NEUTROABS 11.1*  --   --  7.8*  HGB 11.3* 9.2* 10.7* 10.1*  HCT 35.4* 28.6* 33.2* 31.9*  MCV 102.3* 102.9* 102.2* 101.9*  PLT 127* 111* 153 712   Basic Metabolic Panel: Recent Labs  Lab 03/04/18 1040 03/06/18 0935 03/06/18 1347 03/07/18 0434 03/08/18 0720 03/09/18 0558  NA  137 132*  --  139 141 139  K 5.8* 6.7* 6.3* 5.3* 4.3 3.7  CL 107 105  --  113* 114* 109  CO2 21* 18*  --  18* 16* 19*  GLUCOSE 231* 171*  --  132* 152* 208*  BUN 71* 100*  --  87* 80* 64*  CREATININE 1.97* 2.70*  --  2.11* 1.91* 1.63*  CALCIUM 10.1 9.8  --  8.9 8.9 9.0  MG  --   --   --   --  1.6* 2.9*   GFR: Estimated Creatinine Clearance: 18 mL/min (A) (by C-G formula based on SCr of 1.63 mg/dL (H)). Liver Function Tests: No results for input(s): AST, ALT, ALKPHOS, BILITOT, PROT, ALBUMIN in the last 168 hours. No results for input(s): LIPASE, AMYLASE in the last 168 hours. No results for input(s): AMMONIA in the last 168 hours. Coagulation Profile: No results for input(s): INR, PROTIME in the last 168 hours. Cardiac Enzymes: No results for input(s): CKTOTAL, CKMB, CKMBINDEX, TROPONINI in the last 168 hours. BNP (last 3 results) No results for input(s): PROBNP in the last 8760 hours. HbA1C: No results for input(s): HGBA1C in the last 72 hours. CBG: Recent Labs  Lab 03/08/18 1123 03/08/18 1618 03/08/18 2131 03/09/18 0723 03/09/18 1129  GLUCAP 161* 166* 131* 204* 207*   Lipid Profile: No results for input(s): CHOL, HDL, LDLCALC, TRIG, CHOLHDL, LDLDIRECT in the last 72 hours. Thyroid Function Tests: No results for input(s): TSH, T4TOTAL, FREET4, T3FREE, THYROIDAB in the last 72 hours. Anemia Panel: Recent Labs    03/07/18 0434 03/07/18 0923  FOLATE  --  8.2  RETICCTPCT 0.9  --    Sepsis Labs: No results for input(s): PROCALCITON, LATICACIDVEN in the last 168 hours.  No results found for this or any previous visit (from the past 240 hour(s)).       Radiology Studies: No results found.      Scheduled Meds: . allopurinol  100 mg Oral Daily  . aspirin  81 mg Oral q morning - 10a  . feeding supplement (ENSURE ENLIVE)  237 mL Oral BID BM  . fluticasone  2 spray Each Nare Daily  . guaiFENesin  600 mg Oral BID  . insulin aspart  0-5 Units Subcutaneous QHS  .  insulin aspart  0-9 Units Subcutaneous TID WC  . levalbuterol  0.63 mg Nebulization BID  . loratadine  10 mg Oral Daily  . oxybutynin  5 mg Oral Daily  . senna  2 tablet Oral QHS  . sodium bicarbonate  650  mg Oral TID   Continuous Infusions: . ampicillin-sulbactam (UNASYN) IV 3 g (03/08/18 1415)     LOS: 3 days    Time spent: 35 minutes    Irine Seal, MD [facility] Pager [phone]  If 7PM-7AM, please contact night-coverage [email or web address] Password [email or web address] 03/09/2018, 1:04 PM

## 2018-03-09 NOTE — Progress Notes (Signed)
Central monitoring called and stated patient had a 6 beat run of VTach. Patient resting in bed with no complaints of chest pain or shortness of breath. Family is at bedside. MD is paged to make aware.

## 2018-03-10 ENCOUNTER — Inpatient Hospital Stay (HOSPITAL_COMMUNITY): Payer: PPO

## 2018-03-10 DIAGNOSIS — Z515 Encounter for palliative care: Secondary | ICD-10-CM

## 2018-03-10 DIAGNOSIS — Z7189 Other specified counseling: Secondary | ICD-10-CM

## 2018-03-10 LAB — ECHOCARDIOGRAM COMPLETE
HEIGHTINCHES: 63 in
WEIGHTICAEL: 1530.87 [oz_av]

## 2018-03-10 LAB — LEGIONELLA PNEUMOPHILA SEROGP 1 UR AG: L. pneumophila Serogp 1 Ur Ag: NEGATIVE

## 2018-03-10 LAB — BASIC METABOLIC PANEL
ANION GAP: 8 (ref 5–15)
BUN: 54 mg/dL — ABNORMAL HIGH (ref 8–23)
CO2: 25 mmol/L (ref 22–32)
Calcium: 9.1 mg/dL (ref 8.9–10.3)
Chloride: 107 mmol/L (ref 98–111)
Creatinine, Ser: 1.47 mg/dL — ABNORMAL HIGH (ref 0.44–1.00)
GFR calc non Af Amer: 31 mL/min — ABNORMAL LOW (ref >60)
GFR, EST AFRICAN AMERICAN: 36 mL/min — AB (ref >60)
Glucose, Bld: 151 mg/dL — ABNORMAL HIGH (ref 70–99)
POTASSIUM: 3.1 mmol/L — AB (ref 3.5–5.1)
Sodium: 140 mmol/L (ref 135–145)

## 2018-03-10 LAB — GLUCOSE, CAPILLARY
GLUCOSE-CAPILLARY: 134 mg/dL — AB (ref 70–99)
Glucose-Capillary: 141 mg/dL — ABNORMAL HIGH (ref 70–99)
Glucose-Capillary: 182 mg/dL — ABNORMAL HIGH (ref 70–99)
Glucose-Capillary: 134 mg/dL — ABNORMAL HIGH (ref 70–99)

## 2018-03-10 LAB — CBC
HCT: 30.4 % — ABNORMAL LOW (ref 36.0–46.0)
Hemoglobin: 9.9 g/dL — ABNORMAL LOW (ref 12.0–15.0)
MCH: 32.9 pg (ref 26.0–34.0)
MCHC: 32.6 g/dL (ref 30.0–36.0)
MCV: 101 fL — AB (ref 80.0–100.0)
NRBC: 0 % (ref 0.0–0.2)
PLATELETS: 187 10*3/uL (ref 150–400)
RBC: 3.01 MIL/uL — ABNORMAL LOW (ref 3.87–5.11)
RDW: 14.8 % (ref 11.5–15.5)
WBC: 7.7 10*3/uL (ref 4.0–10.5)

## 2018-03-10 MED ORDER — AMOXICILLIN-POT CLAVULANATE 500-125 MG PO TABS
500.0000 mg | ORAL_TABLET | Freq: Two times a day (BID) | ORAL | Status: DC
  Administered 2018-03-10 – 2018-03-11 (×3): 500 mg via ORAL
  Filled 2018-03-10 (×3): qty 1

## 2018-03-10 MED ORDER — RAMELTEON 8 MG PO TABS
8.0000 mg | ORAL_TABLET | Freq: Every day | ORAL | Status: DC
  Administered 2018-03-10: 8 mg via ORAL
  Filled 2018-03-10 (×2): qty 1

## 2018-03-10 NOTE — Progress Notes (Signed)
  Speech Language Pathology Treatment: Dysphagia  Patient Details Name: Bridget Rowland MRN: [id number] DOB: [birth year] Today's Date: 03/10/2018 Time: 6269-4854 SLP Time Calculation (min) (ACUTE ONLY): 12 min  Assessment / Plan / Recommendation Clinical Impression  Pt was provided ongoing dianostic dysphagia therapy. Pt was sitting upright in the chair when SLP entered the room reporting she got "choked" on NTL prior to SLP entering the room. She consumed sips of NTL with SLP present without incident. She consumed trials of thin liquids via straw with noted continued audible swallow and occasional wet vocal quality. Pt consumed 3 tsp bites of pudding reporting she has "no appetite and can't eat any more". Note CXR from 12/27 reports "New left lower lobe atelectasis or infiltrate". Recommend proceed with MBS to objectively assess the swallowing function and to provide recommendation of least restrictive diet.   HPI HPI: Bridget Rowland is a 84 y.o. female with medical history significant for CAD, diabetes type 2, dyslipidemia, hypertension, and CKD-likely stage IV with prior creatinine known to be 1.97 who presented to the ED for evaluation of altered mental status after her son found her at home quite weak and confused.  According to the daughter at the bedside, she has been coming to stay with her mother several times per week as she has been more generally weak and has had a poor appetite.  She states that this is been going on for the last 6 weeks.  She was actually doing quite well yesterday at a Christmas party that the family had and did not expect to find her in such a condition today.  Patient states that she has been having worsening urine output as well and has been trying to take her home medications as otherwise prescribed.  She has been losing weight as a result of her poor appetite but denies any nausea, vomiting, diarrhea, chest pain, or shortness of breath. BSE requested by Palliative care team  due to recent esophagram showing suspected aspiration of water when taking barium tablet.          Recommendations  Medication Administration: Whole meds with puree Compensations: Slow rate;Small sips/bites;Lingual sweep for clearance of pocketing               Oral Care Recommendations: Oral care BID;Staff/trained caregiver to provide oral care SLP Visit Diagnosis: Dysphagia, unspecified (R13.10)       Kamrynn Melott H. Roddie Mc, CCC-SLP Speech Language Pathologist    Wende Bushy 03/10/2018, 12:50 PM

## 2018-03-10 NOTE — Progress Notes (Signed)
Palliative: Mrs. Bachmeier is resting quietly in bed.  She greets me making and keeping eye contact.  She is calm and cooperative, pleasant.  Present today at bedside is her son, [name]. They tell me that Mrs. Probasco is interested in rehab after hospitalization.  We talked about rehab as a benefit, safely bridging from hospital to home.  I answered patient and family questions related to rehab to the best of my abilities, advised that SW will continue conversation, and have referred to SW.   We talked about today's speech therapy evaluation, no questions. We also talked about the benefits of in-home hospice for treat the treatable care. 44 minutes Quinn Axe, NP Palliative Medicine Team Team Phone # [phone]  Greater than 50% of this time was spent counseling and coordinating care related to the above assessment and plan.

## 2018-03-10 NOTE — Care Management Important Message (Signed)
Important Message  Patient Details  Name: Bridget Rowland MRN: [id number] Date of Birth: 09/20/1925   Medicare Important Message Given:  Yes    Shelda Altes 03/10/2018, 12:17 PM

## 2018-03-10 NOTE — Progress Notes (Deleted)
Pharmacy Antibiotic Note  Bridget Rowland is a 84 y.o. female admitted on 03/06/2018 with aspiration pneumonia.  Pharmacy has been consulted for Unasyn dosing. D#4 of Unasyn, Scr improved from admission. Afebrile, WBC nl.  Plan: Increase unasyn 3gm IV q12h Monitor labs, c/s, and patient improvement.  Height: 5\' 3"  (160 cm) Weight: 109 lb 2 oz (49.5 kg) IBW/kg (Calculated) : 52.4  Temp (24hrs), Avg:97.6 F (36.4 C), Min:97.5 F (36.4 C), Max:97.7 F (36.5 C)  Recent Labs  Lab 03/06/18 0935 03/07/18 0434 03/08/18 0720 03/09/18 0558 03/10/18 0439  WBC 12.6* 9.7 12.4* 9.5 7.7  CREATININE 2.70* 2.11* 1.91* 1.63* 1.47*    Estimated Creatinine Clearance: 19.9 mL/min (A) (by C-G formula based on SCr of 1.47 mg/dL (H)).    Allergies  Allergen Reactions  . Actos [Pioglitazone Hydrochloride]     Reaction:unknown    Antimicrobials this admission: Unasyn 12/27 >>      Dose adjustments this admission: 12/20 Increase unasyn 3gm IV q12h  Microbiology results: No cultures  Thank you for allowing pharmacy to be a part of this patient's care.  Isac Sarna, BS [place], [place] Clinical Pharmacist Pager [phone] 03/10/2018 12:26 PM

## 2018-03-10 NOTE — Plan of Care (Signed)
  Problem: Acute Rehab PT Goals(only PT should resolve) Goal: Pt Will Go Supine/Side To Sit Flowsheets (Taken 03/10/2018 1231) Pt will go Supine/Side to Sit: with supervision Goal: Patient Will Transfer Sit To/From Stand Flowsheets (Taken 03/10/2018 1231) Patient will transfer sit to/from stand: with min guard assist Goal: Pt Will Transfer Bed To Chair/Chair To Bed Flowsheets (Taken 03/10/2018 1231) Pt will Transfer Bed to Chair/Chair to Bed: min guard assist Goal: Pt Will Ambulate Flowsheets (Taken 03/10/2018 1231) Pt will Ambulate: 25 feet; with minimal assist; with rolling walker   12:32 PM, 03/10/18 Lonell Grandchild, MPT Physical Therapist with [facility] [phone] office [phone] mobile phone

## 2018-03-10 NOTE — Evaluation (Signed)
Physical Therapy Evaluation Patient Details Name: Bridget Rowland MRN: [id number] DOB: 1925-06-09 Today's Date: 03/10/2018   History of Present Illness  Bridget Rowland is a 84 y.o. female with medical history significant for CAD, diabetes type 2, dyslipidemia, hypertension, and CKD-likely stage IV with prior creatinine known to be 1.97 who presented to the ED for evaluation of altered mental status after her son found her at home quite weak and confused.  According to the daughter at the bedside, she has been coming to stay with her mother several times per week as she has been more generally weak and has had a poor appetite.  She states that this is been going on for the last 6 weeks.  She was actually doing quite well yesterday at a Christmas party that the family had and did not expect to find her in such a condition today.  Patient states that she has been having worsening urine output as well and has been trying to take her home medications as otherwise prescribed.  She has been losing weight as a result of her poor appetite but denies any nausea, vomiting, diarrhea, chest pain, or shortness of breath.    Clinical Impression  Patient limited for functional mobility as stated below secondary to BLE weakness, fatigue and poor standing balance.  Patient will benefit from continued physical therapy in hospital and recommended venue below to increase strength, balance, endurance for safe ADLs and gait.      Follow Up Recommendations SNF    Equipment Recommendations       Recommendations for Other Services       Precautions / Restrictions Precautions Precautions: Fall Restrictions Weight Bearing Restrictions: No      Mobility  Bed Mobility Overal bed mobility: Needs Assistance Bed Mobility: Supine to Sit     Supine to sit: Min assist     General bed mobility comments: labored movement  Transfers Overall transfer level: Needs assistance Equipment used: Rolling walker (2  wheeled) Transfers: Sit to/from [facility] Sit to Stand: Min assist Stand pivot transfers: Min assist       General transfer comment: slow labored movement  Ambulation/Gait Ambulation/Gait assistance: Min assist;Mod assist Gait Distance (Feet): 5 Feet Assistive device: Rolling walker (2 wheeled) Gait Pattern/deviations: Decreased step length - right;Decreased step length - left;Decreased stride length Gait velocity: slow   General Gait Details: limited to 6-7 steps at bedside due to generalized weakness and fatigue  Stairs            Wheelchair Mobility    Modified Rankin (Stroke Patients Only)       Balance Overall balance assessment: Needs assistance Sitting-balance support: Feet supported;No upper extremity supported Sitting balance-Leahy Scale: Good     Standing balance support: During functional activity;Single extremity supported Standing balance-Leahy Scale: Poor Standing balance comment: fair with RW                             Pertinent Vitals/Pain Pain Assessment: No/denies pain    Home Living Family/patient expects to be discharged to:: Private residence Living Arrangements: Children Available Help at Discharge: Family Type of Home: House Home Access: Stairs to enter Entrance Stairs-Rails: Right;Left;Can reach both Entrance Stairs-Number of Steps: 2 Home Layout: One level Home Equipment: Kasandra Knudsen - quad;Walker - 2 wheels;Bedside commode      Prior Function Level of Independence: Independent with assistive device(s)         Comments: househlold and  short distanced community ambulator with Quad-cane, drives     Hand Dominance        Extremity/Trunk Assessment   Upper Extremity Assessment Upper Extremity Assessment: Generalized weakness    Lower Extremity Assessment Lower Extremity Assessment: Generalized weakness    Cervical / Trunk Assessment Cervical / Trunk Assessment: Normal  Communication    Communication: No difficulties  Cognition Arousal/Alertness: Awake/alert Behavior During Therapy: WFL for tasks assessed/performed Overall Cognitive Status: Within Functional Limits for tasks assessed                                        General Comments      Exercises     Assessment/Plan    PT Assessment Patient needs continued PT services  PT Problem List Decreased strength;Decreased activity tolerance;Decreased balance;Decreased mobility       PT Treatment Interventions Gait training;Stair training;Functional mobility training;Therapeutic activities;Patient/family education;Therapeutic exercise    PT Goals (Current goals can be found in the Care Plan section)  Acute Rehab PT Goals Patient Stated Goal: return home after rehab PT Goal Formulation: With patient Time For Goal Achievement: 03/24/18 Potential to Achieve Goals: Good    Frequency Min 3X/week   Barriers to discharge        Co-evaluation               AM-PAC PT "6 Clicks" Mobility  Outcome Measure Help needed turning from your back to your side while in a flat bed without using bedrails?: None Help needed moving from lying on your back to sitting on the side of a flat bed without using bedrails?: Total Help needed moving to and from a bed to a chair (including a wheelchair)?: Total Help needed standing up from a chair using your arms (e.g., wheelchair or bedside chair)?: A Lot Help needed to walk in hospital room?: A Lot Help needed climbing 3-5 steps with a railing? : A Lot 6 Click Score: 12    End of Session Equipment Utilized During Treatment: Gait belt Activity Tolerance: Patient limited by fatigue;Patient tolerated treatment well Patient left: in chair;with call bell/phone within reach Nurse Communication: Mobility status PT Visit Diagnosis: Unsteadiness on feet (R26.81);Other abnormalities of gait and mobility (R26.89);Muscle weakness (generalized) (M62.81)    Time:  7106-2694 PT Time Calculation (min) (ACUTE ONLY): 31 min   Charges:   PT Evaluation $PT Eval Moderate Complexity: 1 Mod PT Treatments $Therapeutic Activity: 23-37 mins        12:30 PM, 03/10/18 Lonell Grandchild, MPT Physical Therapist with [facility] 336 [phone] office [phone] mobile phone w

## 2018-03-10 NOTE — Progress Notes (Signed)
Modified Barium Swallow Progress Note  Patient Details  Name: Bridget Rowland MRN: [id number] Date of Birth: 09-21-1925  Today's Date: 03/10/2018  Modified Barium Swallow completed.  Full report located under Chart Review in the Imaging Section.  Brief recommendations include the following:  Clinical Impression  Pt with mild oropharyngeal dysphagia in setting of limited upper dentition and xerostomic oral cavity with premature spillage of liquids occasionally to the level of the pyriforms resulting in occasional trace, flash penetration (immediately expelled) of thins (tsp and straw) and NO aspiration observed. Epiglottic deflection appeared to completely deflect and limited to no residuals present in pharynx post swallow. Pt was presented with barium tablet in puree due to suspected aspiration of water during barium swallow a few weeks ago when pill was presented with thin water. The barium tablet was swallowed without delay. Esophageal sweep revealed barium stasis throughout the entire thoracic esophagus with some retrograde movement into the cervical esophagus, but eventually cleared with liquid wash (suspect age related dysmotility however no radiologist present to confirm). Recommend D3/mech soft and thin liquids when Pt is alert and upright, taking small sips and cues for lingual clearance with dry solids. Continue PO medications whole in puree. No further SLP services indicated at this time. Results and recommendations were reviewed with the Pt and RN (no family present).    Swallow Evaluation Recommendations       SLP Diet Recommendations: Dysphagia 3 (Mech soft) solids;Thin liquid(pills whole in puree)   Liquid Administration via: Cup   Medication Administration: Whole meds with puree   Supervision: Patient able to self feed;Intermittent supervision to cue for compensatory strategies   Compensations: Slow rate;Small sips/bites;Lingual sweep for clearance of pocketing   Postural  Changes: Remain semi-upright after after feeds/meals (Comment);Seated upright at 90 degrees   Oral Care Recommendations: Oral care BID;Staff/trained caregiver to provide oral care   Other Recommendations: Clarify dietary restrictions   Thank you,  Genene Churn, [place] 03/10/2018,3:37 PM

## 2018-03-10 NOTE — Clinical Social Work Placement (Signed)
   CLINICAL SOCIAL WORK PLACEMENT  NOTE  Date:  03/10/2018  Patient Details  Name: Bridget Rowland MRN: [id number] Date of Birth: 06-Dec-1925  Clinical Social Work is seeking post-discharge placement for this patient at the   level of care (*CSW will initial, date and re-position this form in  chart as items are completed):  Yes   Patient/family provided with Agency Work Department's list of facilities offering this level of care within the geographic area requested by the patient (or if unable, by the patient's family).      Patient/family informed of their freedom to choose among providers that offer the needed level of care, that participate in Medicare, Medicaid or managed care program needed by the patient, have an available bed and are willing to accept the patient.  Yes   Patient/family informed of [facility]'s ownership interest in [facility] and [facility], as well as of the fact that they are under no obligation to receive care at these facilities.  PASRR submitted to EDS on 03/10/18     PASRR number received on 03/10/18     Existing PASRR number confirmed on       FL2 transmitted to all facilities in geographic area requested by pt/family on 03/10/18     FL2 transmitted to all facilities within larger geographic area on       Patient informed that his/her managed care company has contracts with or will negotiate with certain facilities, including the following:            Patient/family informed of bed offers received.  Patient chooses bed at       Physician recommends and patient chooses bed at      Patient to be transferred to   on  .  Patient to be transferred to facility by       Patient family notified on   of transfer.  Name of family member notified:        PHYSICIAN       Additional Comment:    _______________________________________________ Ihor Gully, LCSW 03/10/2018, 3:19 PM

## 2018-03-10 NOTE — Clinical Social Work Note (Signed)
Clinical Social Work Assessment  Patient Details  Name: Bridget Rowland MRN: [id number] Date of Birth: 03-15-1925  Date of referral:  03/10/18               Reason for consult:  Facility Placement                Permission sought to share information with:    Permission granted to share information::     Name::        Agency::     Relationship::     Contact Information:  son, [name]   Housing/Transportation Living arrangements for the past 2 months:  Single Family Home Source of Information:  Patient, Adult Children Patient Interpreter Needed:  None Criminal Activity/Legal Involvement Pertinent to Current Situation/Hospitalization:  No - Comment as needed Significant Relationships:  Adult Children, Significant Other Lives with:  Self Do you feel safe going back to the place where you live?  Yes Need for family participation in patient care:  Yes (Comment)  Care giving concerns:  Patient's daughter spends three nights per week with patient currently.    Social Worker assessment / plan:  At baseline, patient lives alone, ambulates with a cane, drives (has not driven in about 3+ weeks) and is independent in her ADLs.  Patient and significant other dance twice per week. Patient states that she would rather go home at discharge. LCSW discussed PT evaluation with son, [name]. [name] stated that he and his sister would discuss discharge plan options and decide whether they wanted SNF or HHPT.  Patient provided Medicare SNF list.  Employment status:  Retired Nurse, adult PT Recommendations:  [place] / Referral to community resources:  [place]  Patient/Family's Response to care:  Patient's and family are considering SNF vs HHPT.  Patient/Family's Understanding of and Emotional Response to Diagnosis, Current Treatment, and Prognosis:  Patient and family understands patient's diagnosis, treatment and prognosis and are  considering options.   Emotional Assessment Appearance:  Appears stated age Attitude/Demeanor/Rapport:    Affect (typically observed):  Accepting, Calm Orientation:  Oriented to Self, Oriented to Place, Oriented to Situation Alcohol / Substance use:  Not Applicable Psych involvement (Current and /or in the community):  No (Comment)  Discharge Needs  Concerns to be addressed:  Discharge Planning Concerns Readmission within the last 30 days:  No Current discharge risk:  Lives alone Barriers to Discharge:  No Barriers Identified   Ihor Gully, LCSW 03/10/2018, 2:56 PM

## 2018-03-10 NOTE — NC FL2 (Signed)
[place] MEDICAID FL2 LEVEL OF CARE SCREENING TOOL     IDENTIFICATION  Patient Name: Bridget Rowland Birthdate: June 14, 1925 Sex: female Admission Date (Current Location): 03/06/2018  [facility] and [place] Number:  [employer] and Address:  [place] [address] [place]      Provider Number: [id number]  Attending Physician Name and Address:  Orson Eva, MD  Relative Name and Phone Number:       Current Level of Care: Hospital Recommended Level of Care: [place] Prior Approval Number:    Date Approved/Denied:   PASRR Number: [id number]  Discharge Plan: SNF    Current Diagnoses: Patient Active Problem List   Diagnosis Date Noted  . Protein-calorie malnutrition, severe 03/07/2018  . Aspiration into airway   . Palliative care encounter   . Hypotension due to hypovolemia   . FTT (failure to thrive) in adult   . Acute metabolic encephalopathy 30/86/5784  . AKI (acute kidney injury) ([place]) 03/06/2018  . Failure to thrive in adult 03/06/2018  . Type 2 diabetes mellitus without complication ([place]) 69/62/9528  . Hyperkalemia 03/06/2018  . Hyponatremia 03/06/2018  . Dyspnea on exertion 02/24/2018  . Weight loss, unintentional 02/24/2018  . Ecchymosis 11/01/2014  . Other specified cardiac dysrhythmias(427.89)   . PPM-[place] 12/01/2008  . DM 11/03/2008  . Essential hypertension 11/03/2008  . Coronary atherosclerosis 11/03/2008    Orientation RESPIRATION BLADDER Height & Weight     Self, Time, Situation, Place  O2(2.5L) Incontinent Weight: 109 lb 2 oz (49.5 kg) Height:  5\' 3"  (160 cm)  BEHAVIORAL SYMPTOMS/MOOD NEUROLOGICAL BOWEL NUTRITION STATUS      Incontinent (Dysphagia 3 (Mech soft);Nectar-thick liquid;Thin liquid(continue thin water between meals))  AMBULATORY STATUS COMMUNICATION OF NEEDS Skin   Limited Assist Verbally Normal                       Personal Care Assistance Level of Assistance  Bathing,  Feeding, Dressing Bathing Assistance: Limited assistance Feeding assistance: Independent Dressing Assistance: Limited assistance     Functional Limitations Info             SPECIAL CARE FACTORS FREQUENCY  PT (By licensed PT)     PT Frequency: 5x/week              Contractures Contractures Info: Not present    Additional Factors Info  Code Status, Allergies Code Status Info: DNR Allergies Info: Actos           Current Medications (03/10/2018):  This is the current hospital active medication list Current Facility-Administered Medications  Medication Dose Route Frequency Provider Last Rate Last Dose  . acetaminophen (TYLENOL) tablet 650 mg  650 mg Oral Q6H PRN Heath Lark D, DO   650 mg at 03/07/18 1209   Or  . acetaminophen (TYLENOL) suppository 650 mg  650 mg Rectal Q6H PRN Manuella Ghazi, Pratik D, DO      . allopurinol (ZYLOPRIM) tablet 100 mg  100 mg Oral Daily Manuella Ghazi, Pratik D, DO   100 mg at 03/10/18 4132  . amoxicillin-clavulanate (AUGMENTIN) 500-125 MG per tablet 500 mg  500 mg Oral BID Orson Eva, MD   500 mg at 03/10/18 0959  . aspirin chewable tablet 81 mg  81 mg Oral q morning - 10a Heath Lark D, DO   81 mg at 03/10/18 4401  . feeding supplement (ENSURE ENLIVE) (ENSURE ENLIVE) liquid 237 mL  237 mL Oral BID BM Manuella Ghazi, Pratik D,  DO   237 mL at 03/10/18 0952  . fluticasone (FLONASE) 50 MCG/ACT nasal spray 2 spray  2 spray Each Nare Daily Eugenie Filler, MD   2 spray at 03/10/18 [phone]  . guaiFENesin (MUCINEX) 12 hr tablet 600 mg  600 mg Oral BID Eugenie Filler, MD   600 mg at 03/10/18 2951  . insulin aspart (novoLOG) injection 0-5 Units  0-5 Units Subcutaneous QHS Heath Lark D, DO   2 Units at 03/09/18 2230  . insulin aspart (novoLOG) injection 0-9 Units  0-9 Units Subcutaneous TID WC Manuella Ghazi, Pratik D, DO   3 Units at 03/10/18 1203  . levalbuterol (XOPENEX) nebulizer solution 0.63 mg  0.63 mg Nebulization BID Eugenie Filler, MD   0.63 mg at 03/10/18 8841  .  loratadine (CLARITIN) tablet 10 mg  10 mg Oral Daily Eugenie Filler, MD   10 mg at 03/10/18 6606  . ondansetron (ZOFRAN) tablet 4 mg  4 mg Oral Q6H PRN Manuella Ghazi, Pratik D, DO       Or  . ondansetron (ZOFRAN) injection 4 mg  4 mg Intravenous Q6H PRN Manuella Ghazi, Pratik D, DO      . oxybutynin (DITROPAN) tablet 5 mg  5 mg Oral Daily Manuella Ghazi, Pratik D, DO   5 mg at 03/10/18 3016  . senna (SENOKOT) tablet 17.2 mg  2 tablet Oral QHS Dellinger, Marianne L, PA-C   17.2 mg at 03/09/18 2101  . sodium bicarbonate tablet 650 mg  650 mg Oral TID Eugenie Filler, MD   650 mg at 03/10/18 0109     Discharge Medications: Please see discharge summary for a list of discharge medications.  Relevant Imaging Results:  Relevant Lab Results:   Additional Information SSN [id number], Clydene Pugh, LCSW

## 2018-03-10 NOTE — Progress Notes (Signed)
OT Cancellation Note  Patient Details Name: CHLOE MIYOSHI MRN: [id number] DOB: 26-Apr-1925   Cancelled Treatment:    Reason Eval/Treat Not Completed: Patient at procedure or test/ unavailable. Will re-attempt OT evaluation tomorrow morning if patient is available.    Ailene Ravel, OTR/L,CBIS  [phone]  03/10/2018, 1:49 PM

## 2018-03-10 NOTE — Progress Notes (Addendum)
PROGRESS NOTE  Bridget Rowland RFF:[id number] DOB: June 13, 1925 DOA: 03/06/2018 PCP: Iona Beard, MD  Brief History:  84 y.o.femalewith medical history significant forCAD, diabetes type 2, dyslipidemia, hypertension, and CKD-likely stage IV with prior creatinine known to be 1.97 who presented to the ED for evaluation of altered mental status after her son found her at home quite weak and confused. According to the daughter at the bedside, she has been coming to stay with her mother several times per week as she has been more generally weak and has had a poor appetite. She states that this is been going on for the last 6 weeks. She was actually doing quite well yesterday at a Christmas party that the family had and did not expect to find her in such a condition today. Patient states that she has been having worsening urine output as well and has been trying to take her home medications as otherwise prescribed. She has been losing weight as a result of her poor appetite but denies any nausea, vomiting, diarrhea, chest pain, or shortness of breath.  ED Course:Vital signs demonstrate some low blood pressure readings that have been confirmed manually. She is noted to have a creatinine of 2.7 with a baseline just 2 days ago of 1.9. BUN is 100 and WBC count is 12.6, sodium 132, potassium 6.3. Hemoglobin is 11.3 with platelet count of 127. CT of the head was performed with no acute findings aside from some atrophy. Patient has been given some calcium gluconate as well as Lokelma and has had no significant findings on telemetry. She has been started on normal saline at 125 cc/h and has received a 500 mL bolus. Family members at bedside understand that she has an overall poor prognosis with little chance for survival past 6 months.  Assessment/Plan: acute metabolic encephalopathy -secondary to acute kidney injury, dehydration andaspiration pneumonia   -Patient on admission was noted to be  hypotensive and hydrated with IV fluids with improvement with blood pressure.  Renal function trending down.   -Chest x-ray done consistent with a left lower lobe infiltrate--personally reviewed  -Patient's mentation has improved and likely close to baseline.  Continue empiric antibiotics.    Acute renal failure on chronic kidney disease stage IV -secondary to hypovolemia and hemodynamic derangement  -Patient noted to be hypotensive on admission.   -Patient also noted to be on ARB and HCTZ on admission.  Last creatinine noted of 1.97 on 03/04/2018.   -No prior labs to compare to.   -ARB and HCTZ on hold and will likely not resume on discharge.  Renal function trending down with hydration and creatinine currently at 1.63.  Saline lock IV fluids.    Hyperkalemia -secondary to worsening renal function in the setting of ARB.  -ARB and diuretics on hold and likely will not resume ARB on discharge.   -Hyperkalemia improved -  Patient given a couple doses of Kayexalate during this hospitalization  Hyponatremia/hypomagnesemia Likely secondary to hypovolemic hyponatremia.  Improved with hydration.  Magnesium repleted.  Follow.  Metabolic acidosis -secondary to worsening renal function.   -Bicarbonate drip was initially started>>> changed to oral bicarbonate -Discontinue bicarbonate as levels have improved--monitor clinically -A.m. BMP  Acute respiratory failure with hypoxia -Secondary to aspiration pneumonia -Stable on 2 L oxygen -Wean oxygen for saturation greater 92%  aspiration pneumonia Noted on chest x-ray.   -Patient had presented with hypotension and acute metabolic encephalopathy.  -Patient initially started on  IV Unasyn>> converted to oral Augmentin -Speech therapy consult appreciated.  MBS performed -Recommended dysphagia 3 diet with thin liquids  Severe protein calorie malnutrition Nutritional supplementation.  Hypertension Continue to hold antihypertensive  medications.  -Blood pressure remained stable off antihypertensive medications  Diabetes mellitus type 2 -Continue sliding scale insulin.  Failure to thrive Patient noted to have some weight loss over the past several months.  Patient with some worsening shortness of breath has been ongoing chronically for the past few months.  Patient with severe protein calorie malnutrition.  Patient with a poor prognosis.  Palliative care consulted and are following..    Goals of care -Patient is DNR -Long discussion with the patient's son at the bedside regarding advanced directives and disposition -Likely discharge to skilled nursing facility with hospice Advance care planning, including the explanation and discussion of advance directives was carried out with the patient and family.  Code status including explanations of "Full Code" and "DNR" and alternatives were discussed in detail.  Discussion of end-of-life issues including but not limited palliative care, hospice care and the concept of hospice, other end-of-life care options, power of attorney for health care decisions, living wills, and physician orders for life-sustaining treatment were also discussed with the patient and family.  Total face to face time 16 minutes.     Disposition Plan:   SNF 12/31 if stable Family Communication:   Son updated at bedside 12/30  Consultants: Palliative medicine  Code Status:  DNR  DVT Prophylaxis:  SCDs   Procedures: As Listed in Progress Note Above  Antibiotics:  IV Unasyn 03/07/2018>>>> 03/09/2018  Augmentin 03/10/2018  Total time spent 35 minutes.  Greater than 50% spent face to face counseling and coordinating care.     Subjective: Patient denies fevers, chills, headache, chest pain, dyspnea, nausea, vomiting, diarrhea, abdominal pain, dysuria, hematuria, hematochezia, and melena.  Objective: Vitals:   03/09/18 2143 03/10/18 0500 03/10/18 0507 03/10/18 0733  BP: (!) 110/51  119/65     Pulse: 70  100   Resp: 20  18   Temp: 97.6 F (36.4 C)  (!) 97.5 F (36.4 C)   TempSrc: Oral  Oral   SpO2: 97%  100% 96%  Weight:  49.5 kg    Height:        Intake/Output Summary (Last 24 hours) at 03/10/2018 1352 Last data filed at 03/10/2018 1225 Gross per 24 hour  Intake 180 ml  Output 400 ml  Net -220 ml   Weight change:  Exam:   General:  Pt is alert, follows commands appropriately, not in acute distress  HEENT: No icterus, No thrush, No neck mass, [place]/AT  Cardiovascular: RRR, S1/S2, no rubs, no gallops  Respiratory: CTA bilaterally, no wheezing, no crackles, no rhonchi  Abdomen: Soft/+BS, non tender, non distended, no guarding  Extremities: No edema, No lymphangitis, No petechiae, No rashes, no synovitis   Data Reviewed: I have personally reviewed following labs and imaging studies Basic Metabolic Panel: Recent Labs  Lab 03/06/18 0935 03/06/18 1347 03/07/18 0434 03/08/18 0720 03/09/18 0558 03/10/18 0439  NA 132*  --  139 141 139 140  K 6.7* 6.3* 5.3* 4.3 3.7 3.1*  CL 105  --  113* 114* 109 107  CO2 18*  --  18* 16* 19* 25  GLUCOSE 171*  --  132* 152* 208* 151*  BUN 100*  --  87* 80* 64* 54*  CREATININE 2.70*  --  2.11* 1.91* 1.63* 1.47*  CALCIUM 9.8  --  8.9  8.9 9.0 9.1  MG  --   --   --  1.6* 2.9*  --    Liver Function Tests: No results for input(s): AST, ALT, ALKPHOS, BILITOT, PROT, ALBUMIN in the last 168 hours. No results for input(s): LIPASE, AMYLASE in the last 168 hours. No results for input(s): AMMONIA in the last 168 hours. Coagulation Profile: No results for input(s): INR, PROTIME in the last 168 hours. CBC: Recent Labs  Lab 03/06/18 0935 03/07/18 0434 03/08/18 0720 03/09/18 0558 03/10/18 0439  WBC 12.6* 9.7 12.4* 9.5 7.7  NEUTROABS 11.1*  --   --  7.8*  --   HGB 11.3* 9.2* 10.7* 10.1* 9.9*  HCT 35.4* 28.6* 33.2* 31.9* 30.4*  MCV 102.3* 102.9* 102.2* 101.9* 101.0*  PLT 127* 111* 153 180 187   Cardiac Enzymes: No results  for input(s): CKTOTAL, CKMB, CKMBINDEX, TROPONINI in the last 168 hours. BNP: Invalid input(s): POCBNP CBG: Recent Labs  Lab 03/09/18 1129 03/09/18 1656 03/09/18 2140 03/10/18 0758 03/10/18 1130  GLUCAP 207* 144* 236* 141* 182*   HbA1C: No results for input(s): HGBA1C in the last 72 hours. Urine analysis:    Component Value Date/Time   COLORURINE YELLOW 03/06/2018 1104   APPEARANCEUR CLEAR 03/06/2018 1104   LABSPEC 1.016 03/06/2018 1104   PHURINE 5.0 03/06/2018 1104   GLUCOSEU >=500 (A) 03/06/2018 1104   HGBUR NEGATIVE 03/06/2018 1104   BILIRUBINUR NEGATIVE 03/06/2018 1104   KETONESUR NEGATIVE 03/06/2018 1104   PROTEINUR NEGATIVE 03/06/2018 1104   NITRITE NEGATIVE 03/06/2018 1104   LEUKOCYTESUR NEGATIVE 03/06/2018 1104   Sepsis Labs: @LABRCNTIP (procalcitonin:4,lacticidven:4) )No results found for this or any previous visit (from the past 240 hour(s)).   Scheduled Meds: . allopurinol  100 mg Oral Daily  . amoxicillin-clavulanate  500 mg Oral BID  . aspirin  81 mg Oral q morning - 10a  . feeding supplement (ENSURE ENLIVE)  237 mL Oral BID BM  . fluticasone  2 spray Each Nare Daily  . guaiFENesin  600 mg Oral BID  . insulin aspart  0-5 Units Subcutaneous QHS  . insulin aspart  0-9 Units Subcutaneous TID WC  . levalbuterol  0.63 mg Nebulization BID  . loratadine  10 mg Oral Daily  . oxybutynin  5 mg Oral Daily  . senna  2 tablet Oral QHS  . sodium bicarbonate  650 mg Oral TID   Continuous Infusions:  Procedures/Studies: Ct Head Wo Contrast  Result Date: 03/06/2018 CLINICAL DATA:  Confusion, altered mental status. EXAM: CT HEAD WITHOUT CONTRAST TECHNIQUE: Contiguous axial images were obtained from the base of the skull through the vertex without intravenous contrast. COMPARISON:  01/20/2004 FINDINGS: Brain: There is atrophy and chronic small vessel disease changes. Vascular: No acute intracranial abnormality. Specifically, no hemorrhage, hydrocephalus, mass lesion,  acute infarction, or significant intracranial injury. Skull: No acute calvarial abnormality. Sinuses/Orbits: Visualized paranasal sinuses and mastoids clear. Orbital soft tissues unremarkable. Other: None IMPRESSION: Atrophy, chronic microvascular disease. No acute intracranial abnormality. Electronically Signed   By: Rolm Baptise M.D.   On: 03/06/2018 12:51   Dg Esophagus  Result Date: 02/18/2018 CLINICAL DATA:  Dysphagia EXAM: ESOPHOGRAM / BARIUM SWALLOW / BARIUM TABLET STUDY TECHNIQUE: Combined double contrast and single contrast examination performed using effervescent crystals, thick barium liquid, and thin barium liquid. The patient was observed with fluoroscopy swallowing a 13 mm barium sulphate tablet. FLUOROSCOPY TIME:  Fluoroscopy Time: 1 minute 24 seconds Radiation Exposure Index (if provided by the fluoroscopic device): 14.3 mGy Number of Acquired Spot  Images: multiple fluoroscopic screen captures COMPARISON:  None. FINDINGS: Esophageal distention: Grossly normal without mass or stricture Filling defects:  None 12.5 mm barium tablet: Patient was unable to swallow the 12.5 mm diameter barium tablet. When attempting to swallow the tablet with water, patient aspirated a significant amount of water, with a weak spontaneous cough and breathing; symptoms resolved after a few voluntary coughs, which were also week period Motility:  Diffuse moderate age-related esophageal dysmotility Mucosa:  Smooth without irregularity or ulceration Hypopharynx/cervical esophagus: Laryngeal penetration to the level of the vocal cords. No witnessed aspiration of barium. However patient had an episode of pronounced coughing and difficulty breathing after attempting to swallow the 12.5 mm barium tablet with water, clinically aspirated. Hiatal hernia:  Absent GE reflux:  Not witnessed during exam Other:  N/A IMPRESSION: Laryngeal penetration to the level of the vocal cords without witnessed aspiration. However patient  clinically aspirated a large volume of water while attempting to swallow the 12.5 mm diameter barium tablet, followed by a weak spontaneous cough and difficulty breathing. Patient was unable to swallow the 12.5 mm diameter barium tablet. Moderate age-related esophageal dysmotility. No gross evidence of esophageal mass or stricture. Electronically Signed   By: Lavonia Dana M.D.   On: 02/18/2018 11:17   Dg Chest Port 1 View  Result Date: 03/07/2018 CLINICAL DATA:  Weakness.  Encephalopathy. EXAM: PORTABLE CHEST 1 VIEW COMPARISON:  Radiographs 05/07/2012. FINDINGS: 1025 hours. Left subclavian pacemaker leads extend into the right atrium and right ventricle. The heart size and mediastinal contours are stable. There is aortic atherosclerosis. There is increased retrocardiac opacity which may reflect atelectasis or early infiltrate. No edema, pneumothorax or significant pleural effusion. The bones appear unchanged. IMPRESSION: New left lower lobe atelectasis or infiltrate. Radiographic follow up recommended. Electronically Signed   By: Richardean Sale M.D.   On: 03/07/2018 10:42    Orson Eva, DO  [facility] Pager [phone]  If 7PM-7AM, please contact night-coverage [email or web address] Password [email or web address] 03/10/2018, 1:52 PM   LOS: 4 days

## 2018-03-11 DIAGNOSIS — E11649 Type 2 diabetes mellitus with hypoglycemia without coma: Secondary | ICD-10-CM | POA: Diagnosis not present

## 2018-03-11 DIAGNOSIS — R41841 Cognitive communication deficit: Secondary | ICD-10-CM | POA: Diagnosis not present

## 2018-03-11 DIAGNOSIS — E1122 Type 2 diabetes mellitus with diabetic chronic kidney disease: Secondary | ICD-10-CM | POA: Diagnosis not present

## 2018-03-11 DIAGNOSIS — R1312 Dysphagia, oropharyngeal phase: Secondary | ICD-10-CM | POA: Diagnosis not present

## 2018-03-11 DIAGNOSIS — I10 Essential (primary) hypertension: Secondary | ICD-10-CM | POA: Diagnosis not present

## 2018-03-11 DIAGNOSIS — G9341 Metabolic encephalopathy: Secondary | ICD-10-CM | POA: Diagnosis not present

## 2018-03-11 DIAGNOSIS — T17908D Unspecified foreign body in respiratory tract, part unspecified causing other injury, subsequent encounter: Secondary | ICD-10-CM | POA: Diagnosis not present

## 2018-03-11 DIAGNOSIS — J690 Pneumonitis due to inhalation of food and vomit: Secondary | ICD-10-CM | POA: Diagnosis not present

## 2018-03-11 DIAGNOSIS — M6281 Muscle weakness (generalized): Secondary | ICD-10-CM | POA: Diagnosis not present

## 2018-03-11 DIAGNOSIS — R2689 Other abnormalities of gait and mobility: Secondary | ICD-10-CM | POA: Diagnosis not present

## 2018-03-11 DIAGNOSIS — N184 Chronic kidney disease, stage 4 (severe): Secondary | ICD-10-CM | POA: Diagnosis not present

## 2018-03-11 DIAGNOSIS — N179 Acute kidney failure, unspecified: Secondary | ICD-10-CM | POA: Diagnosis not present

## 2018-03-11 DIAGNOSIS — E871 Hypo-osmolality and hyponatremia: Secondary | ICD-10-CM | POA: Diagnosis not present

## 2018-03-11 DIAGNOSIS — R5381 Other malaise: Secondary | ICD-10-CM | POA: Diagnosis not present

## 2018-03-11 DIAGNOSIS — J9601 Acute respiratory failure with hypoxia: Secondary | ICD-10-CM | POA: Diagnosis not present

## 2018-03-11 DIAGNOSIS — Z7401 Bed confinement status: Secondary | ICD-10-CM | POA: Diagnosis not present

## 2018-03-11 DIAGNOSIS — N183 Chronic kidney disease, stage 3 (moderate): Secondary | ICD-10-CM | POA: Diagnosis not present

## 2018-03-11 DIAGNOSIS — R627 Adult failure to thrive: Secondary | ICD-10-CM | POA: Diagnosis not present

## 2018-03-11 DIAGNOSIS — E43 Unspecified severe protein-calorie malnutrition: Secondary | ICD-10-CM | POA: Diagnosis not present

## 2018-03-11 DIAGNOSIS — Z7189 Other specified counseling: Secondary | ICD-10-CM | POA: Diagnosis not present

## 2018-03-11 DIAGNOSIS — E875 Hyperkalemia: Secondary | ICD-10-CM | POA: Diagnosis not present

## 2018-03-11 LAB — GLUCOSE, CAPILLARY
Glucose-Capillary: 132 mg/dL — ABNORMAL HIGH (ref 70–99)
Glucose-Capillary: 102 mg/dL — ABNORMAL HIGH (ref 70–99)
Glucose-Capillary: 108 mg/dL — ABNORMAL HIGH (ref 70–99)

## 2018-03-11 LAB — BASIC METABOLIC PANEL
Anion gap: 11 (ref 5–15)
BUN: 43 mg/dL — ABNORMAL HIGH (ref 8–23)
CO2: 25 mmol/L (ref 22–32)
Calcium: 9.1 mg/dL (ref 8.9–10.3)
Chloride: 104 mmol/L (ref 98–111)
Creatinine, Ser: 1.17 mg/dL — ABNORMAL HIGH (ref 0.44–1.00)
GFR calc Af Amer: 48 mL/min — ABNORMAL LOW (ref >60)
GFR calc non Af Amer: 41 mL/min — ABNORMAL LOW (ref >60)
GLUCOSE: 138 mg/dL — AB (ref 70–99)
Potassium: 3 mmol/L — ABNORMAL LOW (ref 3.5–5.1)
Sodium: 140 mmol/L (ref 135–145)

## 2018-03-11 LAB — CBC
HEMATOCRIT: 33.1 % — AB (ref 36.0–46.0)
Hemoglobin: 10.7 g/dL — ABNORMAL LOW (ref 12.0–15.0)
MCH: 32.7 pg (ref 26.0–34.0)
MCHC: 32.3 g/dL (ref 30.0–36.0)
MCV: 101.2 fL — ABNORMAL HIGH (ref 80.0–100.0)
Platelets: 213 10*3/uL (ref 150–400)
RBC: 3.27 MIL/uL — ABNORMAL LOW (ref 3.87–5.11)
RDW: 14.6 % (ref 11.5–15.5)
WBC: 7.3 10*3/uL (ref 4.0–10.5)
nRBC: 0 % (ref 0.0–0.2)

## 2018-03-11 MED ORDER — POTASSIUM CHLORIDE CRYS ER 20 MEQ PO TBCR
40.0000 meq | EXTENDED_RELEASE_TABLET | Freq: Once | ORAL | Status: AC
  Administered 2018-03-11: 40 meq via ORAL
  Filled 2018-03-11: qty 2

## 2018-03-11 MED ORDER — METOPROLOL SUCCINATE ER 25 MG PO TB24
25.0000 mg | ORAL_TABLET | Freq: Every day | ORAL | Status: DC
  Administered 2018-03-11: 25 mg via ORAL
  Filled 2018-03-11: qty 1

## 2018-03-11 MED ORDER — AMOXICILLIN-POT CLAVULANATE 500-125 MG PO TABS: 500.0000 mg | ORAL_TABLET | Freq: Two times a day (BID) | ORAL | 0 refills | Status: AC

## 2018-03-11 NOTE — Evaluation (Signed)
Occupational Therapy Evaluation Patient Details Name: Bridget Rowland MRN: [id number] DOB: Nov 18, 1925 Today's Date: 03/11/2018    History of Present Illness AL GAGEN is a 84 y.o. female with medical history significant for CAD, diabetes type 2, dyslipidemia, hypertension, and CKD-likely stage IV with prior creatinine known to be 1.97 who presented to the ED for evaluation of altered mental status after her son found her at home quite weak and confused.  According to the daughter at the bedside, she has been coming to stay with her mother several times per week as she has been more generally weak and has had a poor appetite.  She states that this is been going on for the last 6 weeks.  She was actually doing quite well yesterday at a Christmas party that the family had and did not expect to find her in such a condition today.  Patient states that she has been having worsening urine output as well and has been trying to take her home medications as otherwise prescribed.  She has been losing weight as a result of her poor appetite but denies any nausea, vomiting, diarrhea, chest pain, or shortness of breath.   Clinical Impression   Pt agreeable to OT evaluation, son present. Pt reports discomfort from constipation this am, requesting to sit on [facility] for a while. Pt requiring increased assistance for standing ADLs due to BLE weakness and limited activity tolerance. Recommend SNF on discharge to improve independence and safety during ADLs as well as improve strength required for ADL completion. Pt and son are agreeable to SNF. No further OT services required while in acute care. Pt left on BSC at her request at end of session with son present.     Follow Up Recommendations  SNF    Equipment Recommendations  None recommended by OT       Precautions / Restrictions Precautions Precautions: Fall Restrictions Weight Bearing Restrictions: No      Mobility Bed Mobility               General  bed mobility comments: pt seated in recliner upon OT arrival  Transfers Overall transfer level: Needs assistance Equipment used: 1 person hand held assist Transfers: Sit to/from Stand;Stand Pivot Transfers Sit to Stand: Min assist Stand pivot transfers: Min assist                ADL either performed or assessed with clinical judgement   ADL Overall ADL's : Needs assistance/impaired                     Lower Body Dressing: Supervision/safety;Sitting/lateral leans   Toilet Transfer: Minimal assistance;Stand-pivot;BSC   Toileting- Clothing Manipulation and Hygiene: Supervision/safety;Sitting/lateral lean               Vision Baseline Vision/History: No visual deficits Patient Visual Report: No change from baseline Vision Assessment?: No apparent visual deficits            Pertinent Vitals/Pain Pain Assessment: No/denies pain     Hand Dominance Right   Extremity/Trunk Assessment Upper Extremity Assessment Upper Extremity Assessment: Generalized weakness   Lower Extremity Assessment Lower Extremity Assessment: Defer to PT evaluation   Cervical / Trunk Assessment Cervical / Trunk Assessment: Normal   Communication Communication Communication: No difficulties   Cognition Arousal/Alertness: Awake/alert Behavior During Therapy: WFL for tasks assessed/performed Overall Cognitive Status: Within Functional Limits for tasks assessed  Home Living   Living Arrangements: Alone Available Help at Discharge: Family;Available PRN/intermittently(dtr stays 3 nights per week) Type of Home: House Home Access: Stairs to enter [employer] of Steps: 2 Entrance Stairs-Rails: Right;Left;Can reach both Home Layout: One level               Home Equipment: Kasandra Knudsen - quad;Walker - 2 wheels;Bedside commode          Prior Functioning/Environment Level of Independence: Independent with assistive  device(s)        Comments: househlold and short distanced community ambulator with Quad-cane, drives. Independent in ADLs        OT Problem List: Decreased strength;Decreased activity tolerance;Impaired balance (sitting and/or standing)      OT Treatment/Interventions:      OT Goals(Current goals can be found in the care plan section) Acute Rehab OT Goals Patient Stated Goal: return home after rehab  OT Frequency:      AM-PAC OT "6 Clicks" Daily Activity     Outcome Measure Help from another person eating meals?: None Help from another person taking care of personal grooming?: None Help from another person toileting, which includes using toliet, bedpan, or urinal?: A Little Help from another person bathing (including washing, rinsing, drying)?: A Little Help from another person to put on and taking off regular upper body clothing?: None Help from another person to put on and taking off regular lower body clothing?: A Little 6 Click Score: 21   End of Session Equipment Utilized During Treatment: Oxygen  Activity Tolerance: Patient tolerated treatment well Patient left: with family/visitor present;Other (comment)(on BSC)  OT Visit Diagnosis: Muscle weakness (generalized) (M62.81)                Time: 9622-2979 OT Time Calculation (min): 18 min Charges:  OT General Charges $OT Visit: 1 Visit OT Evaluation $OT Eval Moderate Complexity: [place], OTR/L  [phone] 03/11/2018, 8:04 AM

## 2018-03-11 NOTE — Clinical Social Work Placement (Signed)
   CLINICAL SOCIAL WORK PLACEMENT  NOTE  Date:  03/11/2018  Patient Details  Name: Bridget Rowland MRN: [id number] Date of Birth: 1925/05/21  Clinical Social Work is seeking post-discharge placement for this patient at the   level of care (*CSW will initial, date and re-position this form in  chart as items are completed):  Yes   Patient/family provided with [facility] Work Department's list of facilities offering this level of care within the geographic area requested by the patient (or if unable, by the patient's family).      Patient/family informed of their freedom to choose among providers that offer the needed level of care, that participate in Medicare, Medicaid or managed care program needed by the patient, have an available bed and are willing to accept the patient.  Yes   Patient/family informed of [facility]'s ownership interest in [facility] and [facility], as well as of the fact that they are under no obligation to receive care at these facilities.  PASRR submitted to EDS on 03/10/18     PASRR number received on 03/10/18     Existing PASRR number confirmed on       FL2 transmitted to all facilities in geographic area requested by pt/family on 03/10/18     FL2 transmitted to all facilities within larger geographic area on       Patient informed that his/her managed care company has contracts with or will negotiate with certain facilities, including the following:        Yes   Patient/family informed of bed offers received.  Patient chooses bed at [facility]     Physician recommends and patient chooses bed at      Patient to be transferred to [facility] on 03/11/18.  Patient to be transferred to facility by [facility]     Patient family notified on 03/11/18 of transfer.  Name of family member notified:  [name], son     PHYSICIAN       Additional Comment:  Discharge clinicals sent to facility. LCSW provided  authorization number to [place] at [place] signing off.   _______________________________________________ Ihor Gully, LCSW 03/11/2018, 2:05 PM

## 2018-03-11 NOTE — Discharge Summary (Addendum)
Physician Discharge Summary  Bridget Rowland XNA:[id number] DOB: 06/29/1925 DOA: 03/06/2018  PCP: Iona Beard, MD  Admit date: 03/06/2018 Discharge date: 03/11/2018  Admitted From: home Disposition:  SNF  Recommendations for Outpatient Follow-up:  1. Follow up with PCP in 1-2 weeks 2. Please obtain BMP/CBC in one week 3. Maintain 3 L nasal cannula.  Wean oxygen for saturation greater than 92% 4. Check CBG once daily.     Discharge Condition: Stable CODE STATUS:DNR Diet recommendation: dysphagia 3 with thin liquids   Brief/Interim Summary: 84 y.o.femalewith medical history significant forCAD, diabetes type 2, dyslipidemia, hypertension, and CKD-likely stage IV with prior creatinine known to be 1.97 who presented to the ED for evaluation of altered mental status after her son found her at home quite weak and confused. According to the daughter at the bedside, she has been coming to stay with her mother several times per week as she has been more generally weak and has had a poor appetite. She states that this is been going on for the last 6 weeks. She was actually doing quite well yesterday at a Christmas party that the family had and did not expect to find her in such a condition today. Patient states that she has been having worsening urine output as well and has been trying to take her home medications as otherwise prescribed. She has been losing weight as a result of her poor appetite but denies any nausea, vomiting, diarrhea, chest pain, or shortness of breath.  ED Course:Vital signs demonstrate some low blood pressure readings that have been confirmed manually. She is noted to have a creatinine of 2.7 with a baseline just 2 days ago of 1.9. BUN is 100 and WBC count is 12.6, sodium 132, potassium 6.3. Hemoglobin is 11.3 with platelet count of 127. CT of the head was performed with no acute findings aside from some atrophy. Patient has been given some calcium gluconate as  well as Lokelma and has had no significant findings on telemetry. She has been started on normal saline at 125 cc/h and has received a 500 mL bolus. Family members at bedside understand that she has an overall poor prognosis  Discharge Diagnoses:  acute metabolic encephalopathy -secondary to acute kidney injury, dehydration andaspiration pneumonia  -Patient on admission was noted to be hypotensive andhydratedwith IV fluids with improvement with blood pressure. Renal function trending down.  -Chest x-ray done consistent with a left lower lobe infiltrate--personally reviewed -Patient's mentation has improved and likely close to baseline. Continue empiric antibiotics.  -Continue Augmentin x3 more days to finish 1 week of therapy  Acute renal failure on chronic kidney disease stage III -secondary to hypovolemia and hemodynamic derangement -Patient noted to be hypotensive on admission.  -Patient also noted to be on ARB and HCTZ on admission. Last creatinine noted of 1.97 on 03/04/2018.  -No prior labs to compare to.  -ARB and HCTZ on hold and will likely not resume on discharge. Renal function trending down with hydrationand creatinine currently at 1.63. Saline lock IV fluids.  -Serum creatinine 1.17 on day of discharge -Suspect her baseline is 1.1- 1.4  Hyperkalemia -secondary to worsening renal function in the setting of ARB. -ARB and diuretics on hold and likely will not resume ARB on discharge.  -Hyperkalemiaimproved - Patient given a couple doses of Kayexalate during this hospitalization -resolved  Hyponatremia/hypomagnesemia Likely secondary to hypovolemic hyponatremia. Improved with hydration. Magnesium repleted. -Na 140 on day of d/c  Metabolic acidosis -secondary to worsening renal function.  -  Bicarbonate drip was initially started>>> changed to oral bicarbonate -Discontinue bicarbonate as levels have improved--monitor clinically  Acute  respiratory failure with hypoxia -Secondary to aspiration pneumonia -Stable on 2 L oxygen -Wean oxygen for saturation greater 92%  aspiration pneumonia Noted on chest x-ray.  -Patient had presented with hypotension and acute metabolic encephalopathy.  -Patient initially started on IV Unasyn>> converted to oral Augmentin x3 more days to finish 1 week of therapy -Speech therapy consult appreciated.  MBS performed -Recommended dysphagia 3 diet with thin liquids  Severe protein calorie malnutrition Nutritional supplementation.  Hypertension Continue to hold antihypertensive medications.  -Blood pressure remained stable off antihypertensive medications  Diabetes mellitus type 2 -Continue sliding scale insulin. -CBGs well controlled during the hospitalization -Given the patient's CKD and continued poor oral intake, allow for liberal glycemic control -In addition, the patient is a significant risk for developing hypoglycemia due to the above reasons.  Therefore, metformin and glipizide will be discontinued -After discharge, will check CBG once daily, and if it remains significantly elevated, oral hypoglycemic agents may need to be restarted  Failure to thrive Patient noted to have some weight loss over the past several months. Patient with some worsening shortness of breath has been ongoing chronically for the past few months. Patient with severe protein calorie malnutrition. Patient with a poor prognosis. Palliative care consulted and are following..   Complete heart block -Status post PPM -Restart metoprolol succinate -03/08/2018 echo technically difficult, normal EF, no WMA  Goals of care -Patient is DNR -Long discussion with the patient's son at the bedside regarding advanced directives and disposition -Likely discharge to skilled nursing facility with hospice Advance care planning, including the explanation and discussion of advance directives was carried out with the  patient and family.  Code status including explanations of "Full Code" and "DNR" and alternatives were discussed in detail.  Discussion of end-of-life issues including but not limited palliative care, hospice care and the concept of hospice, other end-of-life care options, power of attorney for health care decisions, living wills, and physician orders for life-sustaining treatment were also discussed with the patient and family.  Total face to face time 16 minutes.    Discharge Instructions   Allergies as of 03/11/2018      Reactions   Actos [pioglitazone Hydrochloride]    Reaction:unknown      Medication List    STOP taking these medications   glipiZIDE 2.5 MG 24 hr tablet Commonly known as:  GLUCOTROL XL   metFORMIN 1000 MG tablet Commonly known as:  GLUCOPHAGE   valsartan-hydrochlorothiazide 80-12.5 MG tablet Commonly known as:  DIOVAN HCT     TAKE these medications   allopurinol 100 MG tablet Commonly known as:  ZYLOPRIM Take 100 mg by mouth daily.   amitriptyline 25 MG tablet Commonly known as:  ELAVIL Take 25 mg by mouth at bedtime.   amoxicillin-clavulanate 500-125 MG tablet Commonly known as:  AUGMENTIN Take 1 tablet (500 mg total) by mouth 2 (two) times daily. X 3 days   aspirin 81 MG tablet Take 81 mg by mouth every morning.   Melatonin 10 MG Caps Take by mouth at bedtime.   metoprolol succinate 25 MG 24 hr tablet Commonly known as:  TOPROL-XL Take 1 tablet (25 mg total) by mouth daily. Take with or immediately following a meal.   oxybutynin 5 MG tablet Commonly known as:  DITROPAN Take 5 mg by mouth daily.   UNABLE TO FIND Take 1 capsule by mouth daily. Med Name:  Triple Chlorophyll       Allergies  Allergen Reactions  . Actos [Pioglitazone Hydrochloride]     Reaction:unknown    Consultations:  palliative   Procedures/Studies: Ct Head Wo Contrast  Result Date: 03/06/2018 CLINICAL DATA:  Confusion, altered mental status. EXAM: CT  HEAD WITHOUT CONTRAST TECHNIQUE: Contiguous axial images were obtained from the base of the skull through the vertex without intravenous contrast. COMPARISON:  01/20/2004 FINDINGS: Brain: There is atrophy and chronic small vessel disease changes. Vascular: No acute intracranial abnormality. Specifically, no hemorrhage, hydrocephalus, mass lesion, acute infarction, or significant intracranial injury. Skull: No acute calvarial abnormality. Sinuses/Orbits: Visualized paranasal sinuses and mastoids clear. Orbital soft tissues unremarkable. Other: None IMPRESSION: Atrophy, chronic microvascular disease. No acute intracranial abnormality. Electronically Signed   By: Rolm Baptise M.D.   On: 03/06/2018 12:51   Dg Esophagus  Result Date: 02/18/2018 CLINICAL DATA:  Dysphagia EXAM: ESOPHOGRAM / BARIUM SWALLOW / BARIUM TABLET STUDY TECHNIQUE: Combined double contrast and single contrast examination performed using effervescent crystals, thick barium liquid, and thin barium liquid. The patient was observed with fluoroscopy swallowing a 13 mm barium sulphate tablet. FLUOROSCOPY TIME:  Fluoroscopy Time: 1 minute 24 seconds Radiation Exposure Index (if provided by the fluoroscopic device): 14.3 mGy Number of Acquired Spot Images: multiple fluoroscopic screen captures COMPARISON:  None. FINDINGS: Esophageal distention: Grossly normal without mass or stricture Filling defects:  None 12.5 mm barium tablet: Patient was unable to swallow the 12.5 mm diameter barium tablet. When attempting to swallow the tablet with water, patient aspirated a significant amount of water, with a weak spontaneous cough and breathing; symptoms resolved after a few voluntary coughs, which were also week period Motility:  Diffuse moderate age-related esophageal dysmotility Mucosa:  Smooth without irregularity or ulceration Hypopharynx/cervical esophagus: Laryngeal penetration to the level of the vocal cords. No witnessed aspiration of barium. However  patient had an episode of pronounced coughing and difficulty breathing after attempting to swallow the 12.5 mm barium tablet with water, clinically aspirated. Hiatal hernia:  Absent GE reflux:  Not witnessed during exam Other:  N/A IMPRESSION: Laryngeal penetration to the level of the vocal cords without witnessed aspiration. However patient clinically aspirated a large volume of water while attempting to swallow the 12.5 mm diameter barium tablet, followed by a weak spontaneous cough and difficulty breathing. Patient was unable to swallow the 12.5 mm diameter barium tablet. Moderate age-related esophageal dysmotility. No gross evidence of esophageal mass or stricture. Electronically Signed   By: Lavonia Dana M.D.   On: 02/18/2018 11:17   Dg Chest Port 1 View  Result Date: 03/07/2018 CLINICAL DATA:  Weakness.  Encephalopathy. EXAM: PORTABLE CHEST 1 VIEW COMPARISON:  Radiographs 05/07/2012. FINDINGS: 1025 hours. Left subclavian pacemaker leads extend into the right atrium and right ventricle. The heart size and mediastinal contours are stable. There is aortic atherosclerosis. There is increased retrocardiac opacity which may reflect atelectasis or early infiltrate. No edema, pneumothorax or significant pleural effusion. The bones appear unchanged. IMPRESSION: New left lower lobe atelectasis or infiltrate. Radiographic follow up recommended. Electronically Signed   By: Richardean Sale M.D.   On: 03/07/2018 10:42   Dg Swallowing Func-speech Pathology  Result Date: 03/10/2018 Objective Swallowing Evaluation: Type of Study: MBS-Modified Barium Swallow Study  Patient Details Name: Bridget Rowland MRN: [id number] Date of Birth: 1925/05/13 Today's Date: 03/10/2018 Time: SLP Start Time (ACUTE ONLY): 1300 -SLP Stop Time (ACUTE ONLY): 1333 SLP Time Calculation (min) (ACUTE ONLY): 33 min Past Medical History: Past Medical  History: Diagnosis Date . CAD (coronary artery disease)  . Diabetes mellitus  . Ecchymosis 11/01/2014   Has bruising left inner labia . Hyperlipidemia  . Hypertension  . Other specified cardiac dysrhythmias(427.89)  Past Surgical History: Past Surgical History: Procedure Laterality Date . APPENDECTOMY   . BACK SURGERY  1995 . CARDIAC CATHETERIZATION   . COLONOSCOPY  08/22/2011  Procedure: COLONOSCOPY;  Surgeon: Daneil Dolin, MD;  Location: [facility];  Service: Endoscopy;  Laterality: N/A;  8:30 AM . EYE SURGERY   . foot surgery Left  . INSERT / REPLACE / REMOVE PACEMAKER   . PACEMAKER INSERTION  10/30/2007  [employer] Zephyr dual lead pacemaker . TONSILLECTOMY AND ADENOIDECTOMY   HPI: Bridget Rowland is a 84 y.o. female with medical history significant for CAD, diabetes type 2, dyslipidemia, hypertension, and CKD-likely stage IV with prior creatinine known to be 1.97 who presented to the ED for evaluation of altered mental status after her son found her at home quite weak and confused.  According to the daughter at the bedside, she has been coming to stay with her mother several times per week as she has been more generally weak and has had a poor appetite.  She states that this is been going on for the last 6 weeks.  She was actually doing quite well yesterday at a Christmas party that the family had and did not expect to find her in such a condition today.  Patient states that she has been having worsening urine output as well and has been trying to take her home medications as otherwise prescribed.  She has been losing weight as a result of her poor appetite but denies any nausea, vomiting, diarrhea, chest pain, or shortness of breath. BSE requested by Palliative care team due to recent esophagram showing suspected aspiration of water when taking barium tablet.  Subjective: "Sometimes I have trouble with water." Assessment / Plan / Recommendation CHL IP CLINICAL IMPRESSIONS 03/10/2018 Clinical Impression Pt with mild oropharyngeal dysphagia in setting of limited upper dentition and xerostomic oral cavity with  premature spillage of liquids occasionally to the level of the pyriforms resulting in occasional trace, flash penetration (immediately expelled) of thins (tsp and straw) and NO aspiration observed. Epiglottic deflection appeared to completely deflect and limited to no residuals present in pharynx post swallow. Pt was presented with barium tablet in puree due to suspected aspiration of water during barium swallow a few weeks ago when pill was presented with thin water. The barium tablet was swallowed without delay. Esophageal sweep revealed barium stasis throughout the entire thoracic esophagus with some retrograde movement into the cervical esophagus, but eventually cleared with liquid wash (suspect age related dysmotility however no radiologist present to confirm). Recommend D3/mech soft and thin liquids when Pt is alert and upright, taking small sips and cues for lingual clearance with dry solids. Continue PO medications whole in puree. No further SLP services indicated at this time. Results and recommendations were reviewed with the Pt and RN (no family present).  SLP Visit Diagnosis Dysphagia, oropharyngeal phase (R13.12) Attention and concentration deficit following -- Frontal lobe and executive function deficit following -- Impact on safety and function Mild aspiration risk   CHL IP TREATMENT RECOMMENDATION 03/10/2018 Treatment Recommendations No treatment recommended at this time   Prognosis 03/10/2018 Prognosis for Safe Diet Advancement Good Barriers to Reach Goals -- Barriers/Prognosis Comment -- CHL IP DIET RECOMMENDATION 03/10/2018 SLP Diet Recommendations Dysphagia 3 (Mech soft) solids;Thin liquid Liquid Administration via  Cup Medication Administration Whole meds with puree Compensations Slow rate;Small sips/bites;Lingual sweep for clearance of pocketing Postural Changes Remain semi-upright after after feeds/meals (Comment);Seated upright at 90 degrees   CHL IP OTHER RECOMMENDATIONS 03/10/2018  Recommended Consults -- Oral Care Recommendations Oral care BID;Staff/trained caregiver to provide oral care Other Recommendations Clarify dietary restrictions   CHL IP FOLLOW UP RECOMMENDATIONS 03/10/2018 Follow up Recommendations 24 hour supervision/assistance   CHL IP FREQUENCY AND DURATION 03/09/2018 Speech Therapy Frequency (ACUTE ONLY) min 2x/week Treatment Duration 1 week      CHL IP ORAL PHASE 03/10/2018 Oral Phase WFL Oral - Pudding Teaspoon -- Oral - Pudding Cup -- Oral - Honey Teaspoon -- Oral - Honey Cup -- Oral - Nectar Teaspoon -- Oral - Nectar Cup -- Oral - Nectar Straw -- Oral - Thin Teaspoon -- Oral - Thin Cup -- Oral - Thin Straw -- Oral - Puree -- Oral - Mech Soft -- Oral - Regular -- Oral - Multi-Consistency -- Oral - Pill -- Oral Phase - Comment --  CHL IP PHARYNGEAL PHASE 03/10/2018 Pharyngeal Phase Impaired Pharyngeal- Pudding Teaspoon -- Pharyngeal -- Pharyngeal- Pudding Cup -- Pharyngeal -- Pharyngeal- Honey Teaspoon -- Pharyngeal -- Pharyngeal- Honey Cup -- Pharyngeal -- Pharyngeal- Nectar Teaspoon -- Pharyngeal -- Pharyngeal- Nectar Cup Delayed swallow initiation-vallecula Pharyngeal -- Pharyngeal- Nectar Straw -- Pharyngeal -- Pharyngeal- Thin Teaspoon Delayed swallow initiation-pyriform sinuses;Penetration/Aspiration during swallow Pharyngeal Material enters airway, remains ABOVE vocal cords then ejected out Pharyngeal- Thin Cup Delayed swallow initiation-vallecula;Delayed swallow initiation-pyriform sinuses Pharyngeal -- Pharyngeal- Thin Straw Delayed swallow initiation-vallecula;Delayed swallow initiation-pyriform sinuses;Penetration/Aspiration before swallow Pharyngeal Material does not enter airway;Material enters airway, remains ABOVE vocal cords then ejected out Pharyngeal- Puree WFL Pharyngeal -- Pharyngeal- Mechanical Soft WFL Pharyngeal -- Pharyngeal- Regular -- Pharyngeal -- Pharyngeal- Multi-consistency -- Pharyngeal -- Pharyngeal- Pill WFL Pharyngeal -- Pharyngeal Comment  mild premature spillage with swallow trigger after spilling to pyriforms liquids at times  CHL IP CERVICAL ESOPHAGEAL PHASE 03/10/2018 Cervical Esophageal Phase Impaired Pudding Teaspoon -- Pudding Cup -- Honey Teaspoon -- Honey Cup -- Nectar Teaspoon -- Nectar Cup -- Nectar Straw -- Thin Teaspoon -- Thin Cup -- Thin Straw -- Puree Esophageal backflow into cervical esophagus Mechanical Soft -- Regular -- Multi-consistency -- Pill -- Cervical Esophageal Comment -- Thank you, Genene Churn, CCC-SLP [phone] PORTER,DABNEY 03/10/2018, 3:46 PM                   Discharge Exam: Vitals:   03/10/18 2128 03/11/18 0628  BP:  (!) 128/94  Pulse:  93  Resp:  17  Temp:  (!) 97.4 F (36.3 C)  SpO2: 98% (!) 87%   Vitals:   03/10/18 1948 03/10/18 2112 03/10/18 2128 03/11/18 0628  BP:  114/67  (!) 128/94  Pulse:  94  93  Resp:  16  17  Temp:  97.6 F (36.4 C)  (!) 97.4 F (36.3 C)  TempSrc:  Oral  Oral  SpO2: 94% (!) 84% 98% (!) 87%  Weight:      Height:        General: Pt is alert, awake, not in acute distress Cardiovascular: RRR, S1/S2 +, no rubs, no gallops Respiratory: Bibasilar rales, right greater than left.  No wheezing.  Good air movement. Abdominal: Soft, NT, ND, bowel sounds + Extremities: no edema, no cyanosis   The results of significant diagnostics from this hospitalization (including imaging, microbiology, ancillary and laboratory) are listed below for reference.    Significant Diagnostic Studies: Ct Head Wo Contrast  Result Date: 03/06/2018 CLINICAL DATA:  Confusion, altered mental status. EXAM: CT HEAD WITHOUT CONTRAST TECHNIQUE: Contiguous axial images were obtained from the base of the skull through the vertex without intravenous contrast. COMPARISON:  01/20/2004 FINDINGS: Brain: There is atrophy and chronic small vessel disease changes. Vascular: No acute intracranial abnormality. Specifically, no hemorrhage, hydrocephalus, mass lesion, acute infarction, or  significant intracranial injury. Skull: No acute calvarial abnormality. Sinuses/Orbits: Visualized paranasal sinuses and mastoids clear. Orbital soft tissues unremarkable. Other: None IMPRESSION: Atrophy, chronic microvascular disease. No acute intracranial abnormality. Electronically Signed   By: Rolm Baptise M.D.   On: 03/06/2018 12:51   Dg Esophagus  Result Date: 02/18/2018 CLINICAL DATA:  Dysphagia EXAM: ESOPHOGRAM / BARIUM SWALLOW / BARIUM TABLET STUDY TECHNIQUE: Combined double contrast and single contrast examination performed using effervescent crystals, thick barium liquid, and thin barium liquid. The patient was observed with fluoroscopy swallowing a 13 mm barium sulphate tablet. FLUOROSCOPY TIME:  Fluoroscopy Time: 1 minute 24 seconds Radiation Exposure Index (if provided by the fluoroscopic device): 14.3 mGy Number of Acquired Spot Images: multiple fluoroscopic screen captures COMPARISON:  None. FINDINGS: Esophageal distention: Grossly normal without mass or stricture Filling defects:  None 12.5 mm barium tablet: Patient was unable to swallow the 12.5 mm diameter barium tablet. When attempting to swallow the tablet with water, patient aspirated a significant amount of water, with a weak spontaneous cough and breathing; symptoms resolved after a few voluntary coughs, which were also week period Motility:  Diffuse moderate age-related esophageal dysmotility Mucosa:  Smooth without irregularity or ulceration Hypopharynx/cervical esophagus: Laryngeal penetration to the level of the vocal cords. No witnessed aspiration of barium. However patient had an episode of pronounced coughing and difficulty breathing after attempting to swallow the 12.5 mm barium tablet with water, clinically aspirated. Hiatal hernia:  Absent GE reflux:  Not witnessed during exam Other:  N/A IMPRESSION: Laryngeal penetration to the level of the vocal cords without witnessed aspiration. However patient clinically aspirated a large  volume of water while attempting to swallow the 12.5 mm diameter barium tablet, followed by a weak spontaneous cough and difficulty breathing. Patient was unable to swallow the 12.5 mm diameter barium tablet. Moderate age-related esophageal dysmotility. No gross evidence of esophageal mass or stricture. Electronically Signed   By: Lavonia Dana M.D.   On: 02/18/2018 11:17   Dg Chest Port 1 View  Result Date: 03/07/2018 CLINICAL DATA:  Weakness.  Encephalopathy. EXAM: PORTABLE CHEST 1 VIEW COMPARISON:  Radiographs 05/07/2012. FINDINGS: 1025 hours. Left subclavian pacemaker leads extend into the right atrium and right ventricle. The heart size and mediastinal contours are stable. There is aortic atherosclerosis. There is increased retrocardiac opacity which may reflect atelectasis or early infiltrate. No edema, pneumothorax or significant pleural effusion. The bones appear unchanged. IMPRESSION: New left lower lobe atelectasis or infiltrate. Radiographic follow up recommended. Electronically Signed   By: Richardean Sale M.D.   On: 03/07/2018 10:42   Dg Swallowing Func-speech Pathology  Result Date: 03/10/2018 Objective Swallowing Evaluation: Type of Study: MBS-Modified Barium Swallow Study  Patient Details Name: Bridget Rowland MRN: [id number] Date of Birth: November 11, 1925 Today's Date: 03/10/2018 Time: SLP Start Time (ACUTE ONLY): 1300 -SLP Stop Time (ACUTE ONLY): 1333 SLP Time Calculation (min) (ACUTE ONLY): 33 min Past Medical History: Past Medical History: Diagnosis Date . CAD (coronary artery disease)  . Diabetes mellitus  . Ecchymosis 11/01/2014  Has bruising left inner labia . Hyperlipidemia  . Hypertension  . Other specified cardiac dysrhythmias(427.89)  Past Surgical  History: Past Surgical History: Procedure Laterality Date . APPENDECTOMY   . BACK SURGERY  1995 . CARDIAC CATHETERIZATION   . COLONOSCOPY  08/22/2011  Procedure: COLONOSCOPY;  Surgeon: Daneil Dolin, MD;  Location: [facility];  Service:  Endoscopy;  Laterality: N/A;  8:30 AM . EYE SURGERY   . foot surgery Left  . INSERT / REPLACE / REMOVE PACEMAKER   . PACEMAKER INSERTION  10/30/2007  [employer] Zephyr dual lead pacemaker . TONSILLECTOMY AND ADENOIDECTOMY   HPI: ADILENE AREOLA is a 84 y.o. female with medical history significant for CAD, diabetes type 2, dyslipidemia, hypertension, and CKD-likely stage IV with prior creatinine known to be 1.97 who presented to the ED for evaluation of altered mental status after her son found her at home quite weak and confused.  According to the daughter at the bedside, she has been coming to stay with her mother several times per week as she has been more generally weak and has had a poor appetite.  She states that this is been going on for the last 6 weeks.  She was actually doing quite well yesterday at a Christmas party that the family had and did not expect to find her in such a condition today.  Patient states that she has been having worsening urine output as well and has been trying to take her home medications as otherwise prescribed.  She has been losing weight as a result of her poor appetite but denies any nausea, vomiting, diarrhea, chest pain, or shortness of breath. BSE requested by Palliative care team due to recent esophagram showing suspected aspiration of water when taking barium tablet.  Subjective: "Sometimes I have trouble with water." Assessment / Plan / Recommendation CHL IP CLINICAL IMPRESSIONS 03/10/2018 Clinical Impression Pt with mild oropharyngeal dysphagia in setting of limited upper dentition and xerostomic oral cavity with premature spillage of liquids occasionally to the level of the pyriforms resulting in occasional trace, flash penetration (immediately expelled) of thins (tsp and straw) and NO aspiration observed. Epiglottic deflection appeared to completely deflect and limited to no residuals present in pharynx post swallow. Pt was presented with barium tablet in puree due to  suspected aspiration of water during barium swallow a few weeks ago when pill was presented with thin water. The barium tablet was swallowed without delay. Esophageal sweep revealed barium stasis throughout the entire thoracic esophagus with some retrograde movement into the cervical esophagus, but eventually cleared with liquid wash (suspect age related dysmotility however no radiologist present to confirm). Recommend D3/mech soft and thin liquids when Pt is alert and upright, taking small sips and cues for lingual clearance with dry solids. Continue PO medications whole in puree. No further SLP services indicated at this time. Results and recommendations were reviewed with the Pt and RN (no family present).  SLP Visit Diagnosis Dysphagia, oropharyngeal phase (R13.12) Attention and concentration deficit following -- Frontal lobe and executive function deficit following -- Impact on safety and function Mild aspiration risk   CHL IP TREATMENT RECOMMENDATION 03/10/2018 Treatment Recommendations No treatment recommended at this time   Prognosis 03/10/2018 Prognosis for Safe Diet Advancement Good Barriers to Reach Goals -- Barriers/Prognosis Comment -- CHL IP DIET RECOMMENDATION 03/10/2018 SLP Diet Recommendations Dysphagia 3 (Mech soft) solids;Thin liquid Liquid Administration via Cup Medication Administration Whole meds with puree Compensations Slow rate;Small sips/bites;Lingual sweep for clearance of pocketing Postural Changes Remain semi-upright after after feeds/meals (Comment);Seated upright at 90 degrees   CHL IP OTHER RECOMMENDATIONS 03/10/2018 Recommended  Consults -- Oral Care Recommendations Oral care BID;Staff/trained caregiver to provide oral care Other Recommendations Clarify dietary restrictions   CHL IP FOLLOW UP RECOMMENDATIONS 03/10/2018 Follow up Recommendations 24 hour supervision/assistance   CHL IP FREQUENCY AND DURATION 03/09/2018 Speech Therapy Frequency (ACUTE ONLY) min 2x/week Treatment Duration  1 week      CHL IP ORAL PHASE 03/10/2018 Oral Phase WFL Oral - Pudding Teaspoon -- Oral - Pudding Cup -- Oral - Honey Teaspoon -- Oral - Honey Cup -- Oral - Nectar Teaspoon -- Oral - Nectar Cup -- Oral - Nectar Straw -- Oral - Thin Teaspoon -- Oral - Thin Cup -- Oral - Thin Straw -- Oral - Puree -- Oral - Mech Soft -- Oral - Regular -- Oral - Multi-Consistency -- Oral - Pill -- Oral Phase - Comment --  CHL IP PHARYNGEAL PHASE 03/10/2018 Pharyngeal Phase Impaired Pharyngeal- Pudding Teaspoon -- Pharyngeal -- Pharyngeal- Pudding Cup -- Pharyngeal -- Pharyngeal- Honey Teaspoon -- Pharyngeal -- Pharyngeal- Honey Cup -- Pharyngeal -- Pharyngeal- Nectar Teaspoon -- Pharyngeal -- Pharyngeal- Nectar Cup Delayed swallow initiation-vallecula Pharyngeal -- Pharyngeal- Nectar Straw -- Pharyngeal -- Pharyngeal- Thin Teaspoon Delayed swallow initiation-pyriform sinuses;Penetration/Aspiration during swallow Pharyngeal Material enters airway, remains ABOVE vocal cords then ejected out Pharyngeal- Thin Cup Delayed swallow initiation-vallecula;Delayed swallow initiation-pyriform sinuses Pharyngeal -- Pharyngeal- Thin Straw Delayed swallow initiation-vallecula;Delayed swallow initiation-pyriform sinuses;Penetration/Aspiration before swallow Pharyngeal Material does not enter airway;Material enters airway, remains ABOVE vocal cords then ejected out Pharyngeal- Puree WFL Pharyngeal -- Pharyngeal- Mechanical Soft WFL Pharyngeal -- Pharyngeal- Regular -- Pharyngeal -- Pharyngeal- Multi-consistency -- Pharyngeal -- Pharyngeal- Pill WFL Pharyngeal -- Pharyngeal Comment mild premature spillage with swallow trigger after spilling to pyriforms liquids at times  CHL IP CERVICAL ESOPHAGEAL PHASE 03/10/2018 Cervical Esophageal Phase Impaired Pudding Teaspoon -- Pudding Cup -- Honey Teaspoon -- Honey Cup -- Nectar Teaspoon -- Nectar Cup -- Nectar Straw -- Thin Teaspoon -- Thin Cup -- Thin Straw -- Puree Esophageal backflow into cervical esophagus  Mechanical Soft -- Regular -- Multi-consistency -- Pill -- Cervical Esophageal Comment -- Thank you, Genene Churn, [facility] [place] 03/10/2018, 3:46 PM                Microbiology: No results found for this or any previous visit (from the past 240 hour(s)).   Labs: Basic Metabolic Panel: Recent Labs  Lab 03/07/18 0434 03/08/18 0720 03/09/18 0558 03/10/18 0439 03/11/18 0531  NA 139 141 139 140 140  K 5.3* 4.3 3.7 3.1* 3.0*  CL 113* 114* 109 107 104  CO2 18* 16* 19* 25 25  GLUCOSE 132* 152* 208* 151* 138*  BUN 87* 80* 64* 54* 43*  CREATININE 2.11* 1.91* 1.63* 1.47* 1.17*  CALCIUM 8.9 8.9 9.0 9.1 9.1  MG  --  1.6* 2.9*  --   --    Liver Function Tests: No results for input(s): AST, ALT, ALKPHOS, BILITOT, PROT, ALBUMIN in the last 168 hours. No results for input(s): LIPASE, AMYLASE in the last 168 hours. No results for input(s): AMMONIA in the last 168 hours. CBC: Recent Labs  Lab 03/06/18 0935 03/07/18 0434 03/08/18 0720 03/09/18 0558 03/10/18 0439 03/11/18 0531  WBC 12.6* 9.7 12.4* 9.5 7.7 7.3  NEUTROABS 11.1*  --   --  7.8*  --   --   HGB 11.3* 9.2* 10.7* 10.1* 9.9* 10.7*  HCT 35.4* 28.6* 33.2* 31.9* 30.4* 33.1*  MCV 102.3* 102.9* 102.2* 101.9* 101.0* 101.2*  PLT 127* 111* 153 180 187 213   Cardiac Enzymes:  No results for input(s): CKTOTAL, CKMB, CKMBINDEX, TROPONINI in the last 168 hours. BNP: Invalid input(s): POCBNP CBG: Recent Labs  Lab 03/10/18 1130 03/10/18 1641 03/10/18 2314 03/11/18 0733 03/11/18 1108  GLUCAP 182* 134* 134* 132* 102*    Time coordinating discharge:  36 minutes  Signed:  Orson Eva, DO [facility] Pager: [phone] 03/11/2018, 11:10 AM

## 2018-03-13 DIAGNOSIS — G9341 Metabolic encephalopathy: Secondary | ICD-10-CM | POA: Diagnosis not present

## 2018-03-13 DIAGNOSIS — E871 Hypo-osmolality and hyponatremia: Secondary | ICD-10-CM | POA: Diagnosis not present

## 2018-03-13 DIAGNOSIS — E875 Hyperkalemia: Secondary | ICD-10-CM | POA: Diagnosis not present

## 2018-03-13 DIAGNOSIS — N183 Chronic kidney disease, stage 3 (moderate): Secondary | ICD-10-CM | POA: Diagnosis not present

## 2018-03-14 ENCOUNTER — Telehealth: Payer: Self-pay

## 2018-03-14 NOTE — Telephone Encounter (Signed)
Pt has been hospitalized since this lab work and medication changes have been made.  No further action needed at this time.

## 2018-03-14 NOTE — Telephone Encounter (Signed)
-----   Message from Evans Lance, MD sent at 03/14/2018  2:18 PM EST ----- Ask that the patient stop diovan/hctz and start hctz 12.5 daily

## 2018-03-18 ENCOUNTER — Other Ambulatory Visit (HOSPITAL_COMMUNITY): Payer: PPO

## 2018-03-20 ENCOUNTER — Other Ambulatory Visit: Payer: Self-pay | Admitting: *Deleted

## 2018-03-20 NOTE — Patient Outreach (Signed)
[facility]) Care Management  03/20/2018  Bridget Rowland 1925-09-19 [id number]   Onsite visit to facility. Met with patient and her daughter Bridget Rowland Pt reports she lives alone, she has a boyfriend and her 2 children help her out.  Patient reports she plans to return home after her stay at rehab, she does not have a discharge date.  Patient reports she has diabetes that is well controlled with oral medications and also has a pacemaker, but "not much else".  Patient states she had been sick for about 3 weeks with a cold and cough and ended up in the hospital with "a touch of pneumonia".   RNCM reviewed [facility] care management, left RNCM contact and packet. Will continue to monitor for any [facility] care management needs.  Royetta Crochet. Laymond Purser, MSN, RN, [employer] , [place] [phone]) Business Cell  [phone]) Toll Free Office

## 2018-03-26 ENCOUNTER — Encounter: Payer: PPO | Admitting: Internal Medicine

## 2018-03-27 ENCOUNTER — Other Ambulatory Visit: Payer: Self-pay | Admitting: *Deleted

## 2018-03-27 NOTE — Patient Outreach (Signed)
[facility]) Care Management  03/27/2018  JESLYNN HOLLANDER 07-24-1925 [id number]   Per telephonic IDT meeting with facility and [facility], this patient's family has decided that patient requires LTC upon completion of Skilled rehab. The facility will work with family to find a place for patient.  No [facility] Care management needs assessed at this time. Plan to sign off. Royetta Crochet. Laymond Purser, MSN, RN, [employer] , [place] [phone]) Business Cell  [phone]) Toll Free Office

## 2018-05-23 DIAGNOSIS — N183 Chronic kidney disease, stage 3 (moderate): Secondary | ICD-10-CM | POA: Diagnosis not present

## 2018-05-23 DIAGNOSIS — E875 Hyperkalemia: Secondary | ICD-10-CM | POA: Diagnosis not present

## 2018-05-23 DIAGNOSIS — G9341 Metabolic encephalopathy: Secondary | ICD-10-CM | POA: Diagnosis not present

## 2018-05-23 DIAGNOSIS — E871 Hypo-osmolality and hyponatremia: Secondary | ICD-10-CM | POA: Diagnosis not present

## 2018-06-02 DIAGNOSIS — H524 Presbyopia: Secondary | ICD-10-CM | POA: Diagnosis not present

## 2018-06-22 DIAGNOSIS — E875 Hyperkalemia: Secondary | ICD-10-CM | POA: Diagnosis not present

## 2018-06-22 DIAGNOSIS — G9341 Metabolic encephalopathy: Secondary | ICD-10-CM | POA: Diagnosis not present

## 2018-06-22 DIAGNOSIS — N183 Chronic kidney disease, stage 3 (moderate): Secondary | ICD-10-CM | POA: Diagnosis not present

## 2018-06-22 DIAGNOSIS — E871 Hypo-osmolality and hyponatremia: Secondary | ICD-10-CM | POA: Diagnosis not present

## 2018-07-25 DIAGNOSIS — E875 Hyperkalemia: Secondary | ICD-10-CM | POA: Diagnosis not present

## 2018-07-25 DIAGNOSIS — G9341 Metabolic encephalopathy: Secondary | ICD-10-CM | POA: Diagnosis not present

## 2018-07-25 DIAGNOSIS — N183 Chronic kidney disease, stage 3 (moderate): Secondary | ICD-10-CM | POA: Diagnosis not present

## 2018-07-25 DIAGNOSIS — E871 Hypo-osmolality and hyponatremia: Secondary | ICD-10-CM | POA: Diagnosis not present

## 2018-08-27 DIAGNOSIS — N183 Chronic kidney disease, stage 3 (moderate): Secondary | ICD-10-CM | POA: Diagnosis not present

## 2018-08-27 DIAGNOSIS — E871 Hypo-osmolality and hyponatremia: Secondary | ICD-10-CM | POA: Diagnosis not present

## 2018-08-27 DIAGNOSIS — E875 Hyperkalemia: Secondary | ICD-10-CM | POA: Diagnosis not present

## 2018-08-27 DIAGNOSIS — G9341 Metabolic encephalopathy: Secondary | ICD-10-CM | POA: Diagnosis not present

## 2018-09-26 DIAGNOSIS — E871 Hypo-osmolality and hyponatremia: Secondary | ICD-10-CM | POA: Diagnosis not present

## 2018-09-26 DIAGNOSIS — N183 Chronic kidney disease, stage 3 (moderate): Secondary | ICD-10-CM | POA: Diagnosis not present

## 2018-09-26 DIAGNOSIS — E875 Hyperkalemia: Secondary | ICD-10-CM | POA: Diagnosis not present

## 2018-09-26 DIAGNOSIS — G9341 Metabolic encephalopathy: Secondary | ICD-10-CM | POA: Diagnosis not present

## 2018-10-21 ENCOUNTER — Telehealth: Payer: Self-pay | Admitting: Internal Medicine

## 2018-10-21 ENCOUNTER — Telehealth: Payer: Self-pay | Admitting: Cardiology

## 2018-10-21 NOTE — Telephone Encounter (Signed)
Patient nurse from [facility] called and stated that patient is on hospice and needed to have PPM turned off. I informed pt nurse that we do not turn of PPM and that once the patient does expire the PPM will not keep her alive. Pt nurse verbalized understanding and requested that I cancel the Wednesday 8-19 appt w/ GT.

## 2018-10-21 NOTE — Telephone Encounter (Signed)
Please give Lyn Henri a call from [facility] @ [phone] ext [phone]   pt is on hospice and they're needing to deactivate her defib

## 2018-10-21 NOTE — Telephone Encounter (Signed)
Refer to other phone for today.

## 2018-10-29 ENCOUNTER — Encounter: Payer: PPO | Admitting: Internal Medicine

## 2018-10-30 DIAGNOSIS — N183 Chronic kidney disease, stage 3 (moderate): Secondary | ICD-10-CM | POA: Diagnosis not present

## 2018-10-30 DIAGNOSIS — E875 Hyperkalemia: Secondary | ICD-10-CM | POA: Diagnosis not present

## 2018-10-30 DIAGNOSIS — G9341 Metabolic encephalopathy: Secondary | ICD-10-CM | POA: Diagnosis not present

## 2018-10-30 DIAGNOSIS — E871 Hypo-osmolality and hyponatremia: Secondary | ICD-10-CM | POA: Diagnosis not present

## 2018-11-14 DIAGNOSIS — Z20828 Contact with and (suspected) exposure to other viral communicable diseases: Secondary | ICD-10-CM | POA: Diagnosis not present

## 2018-12-11 DEATH — deceased

## 2019-12-24 IMAGING — RF DG ESOPHAGUS
9 series · 15 of 24 positions shown · non-contrast
Comparison: None.

CLINICAL DATA: Dysphagia

EXAM:
ESOPHOGRAM / BARIUM SWALLOW / BARIUM TABLET STUDY
TECHNIQUE: Combined double contrast and single contrast examination performed
using effervescent crystals, thick barium liquid, and thin barium
liquid. The patient was observed with fluoroscopy swallowing a 13 mm
barium sulphate tablet.
FLUOROSCOPY TIME:  Fluoroscopy Time: 1 minute 24 seconds
Radiation Exposure Index (if provided by the fluoroscopic device):
14.3 mGy
Number of Acquired Spot Images: multiple fluoroscopic screen
captures

[Series 1: cp_standard · 0.18mm/px · 2 of 91 frames shown (1 of 9)]
[frame 1/91]
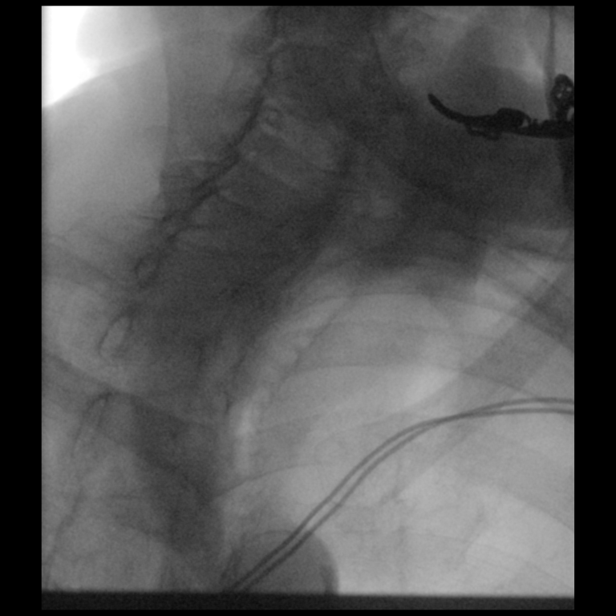
[frame 78/91]
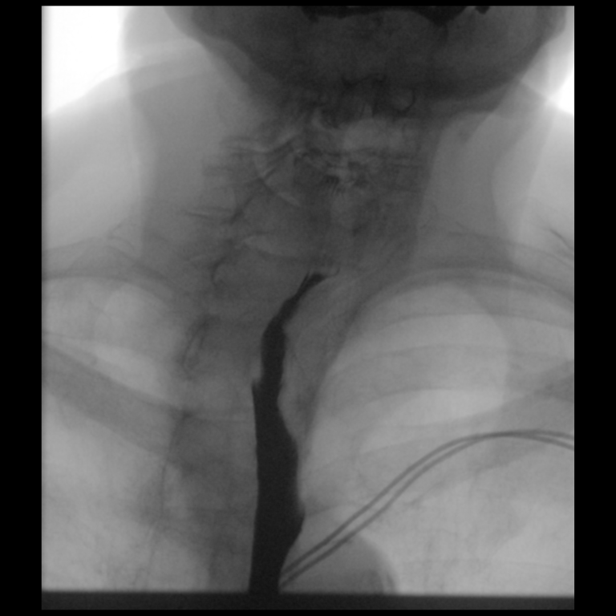

[Series 2: cp_standard · 0.18mm/px · 1 of 96 frames shown (2 of 9)]
[frame 49/96]
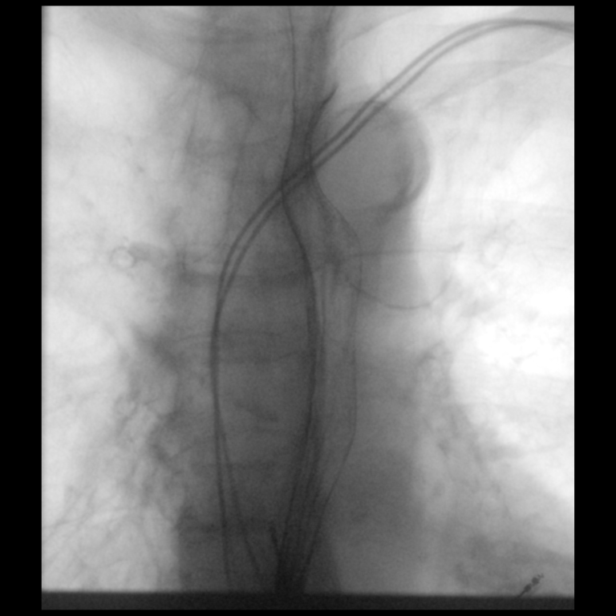

[Series 2: cp_standard · 0.18mm/px · 2 of 32 frames shown (3 of 9)]
[frame 5/32]
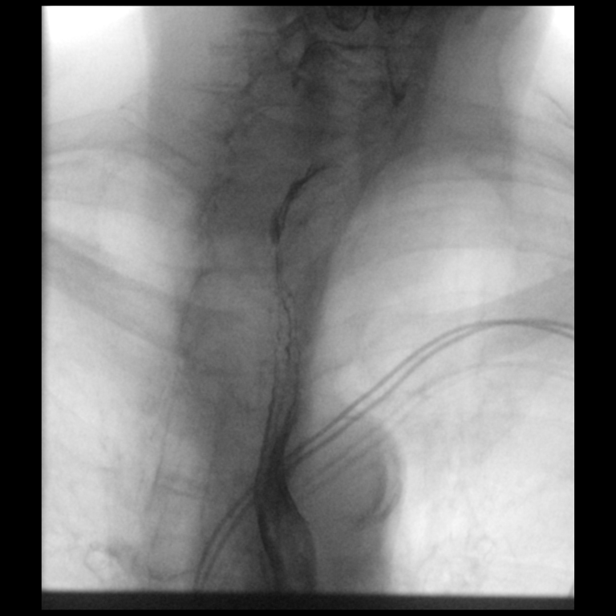
[frame 28/32]
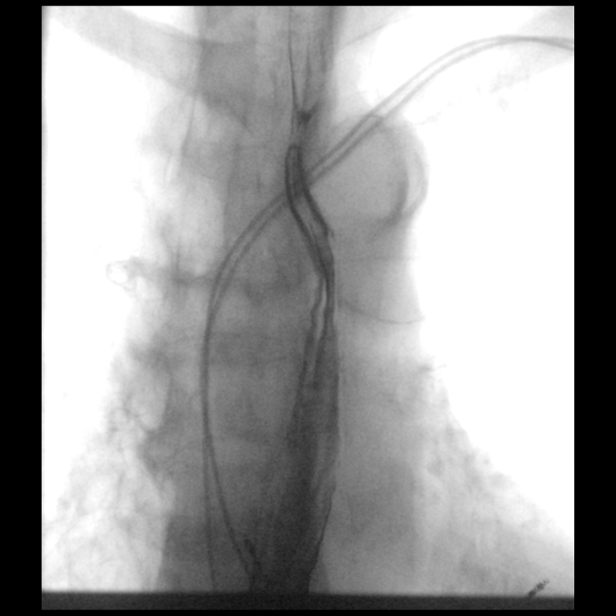

[Series 4: cp_standard · 0.18mm/px · 2 of 53 frames shown (4 of 9)]
[frame 8/53]
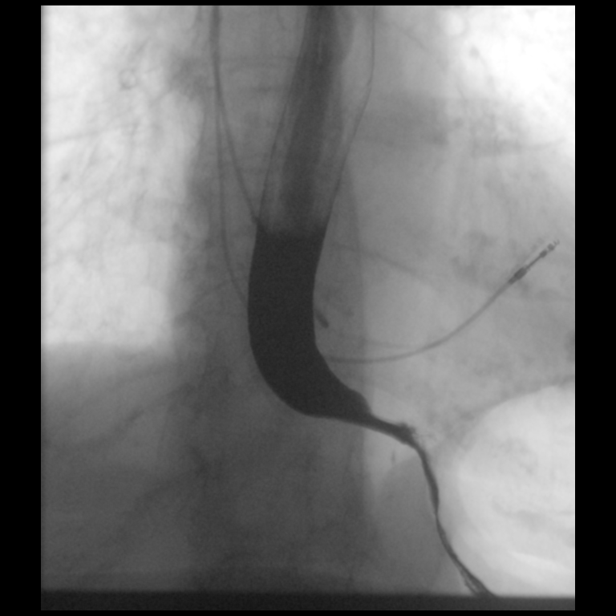
[frame 47/53]
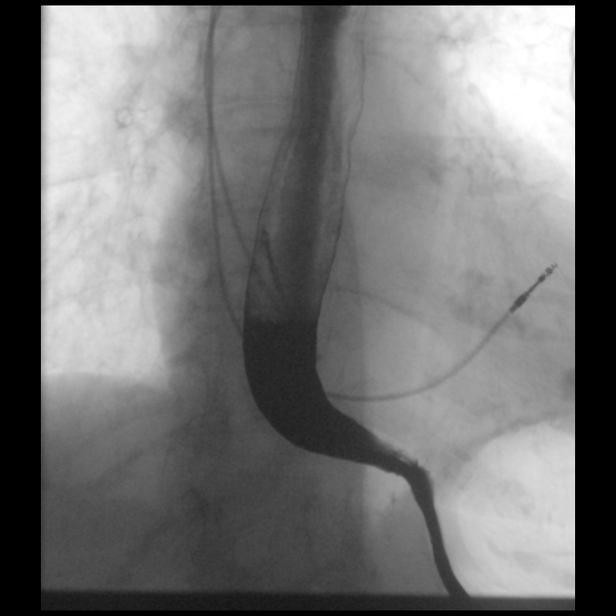

[Series 5: cp_standard · 0.18mm/px · 1 of 54 frames shown (5 of 9)]
[frame 46/54]
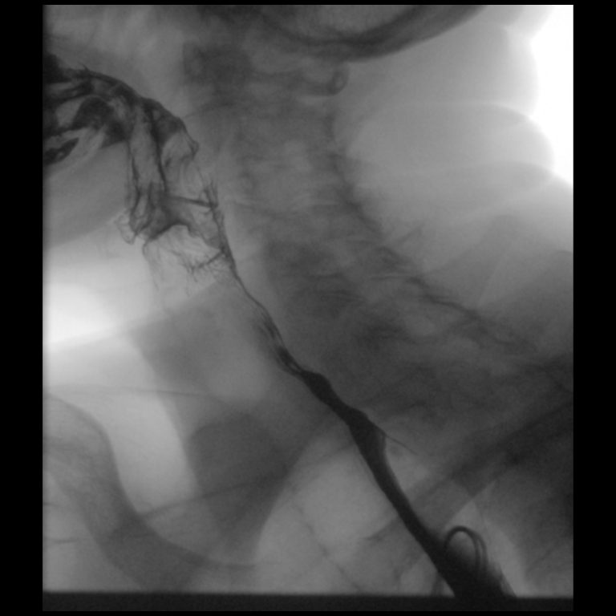

[Series 6: cp_standard · 0.18mm/px · 2 of 37 frames shown (6 of 9)]
[frame 6/37]
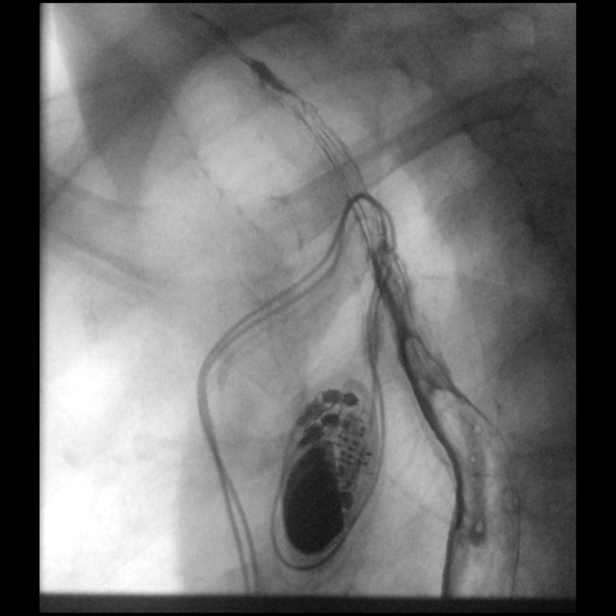
[frame 35/37]
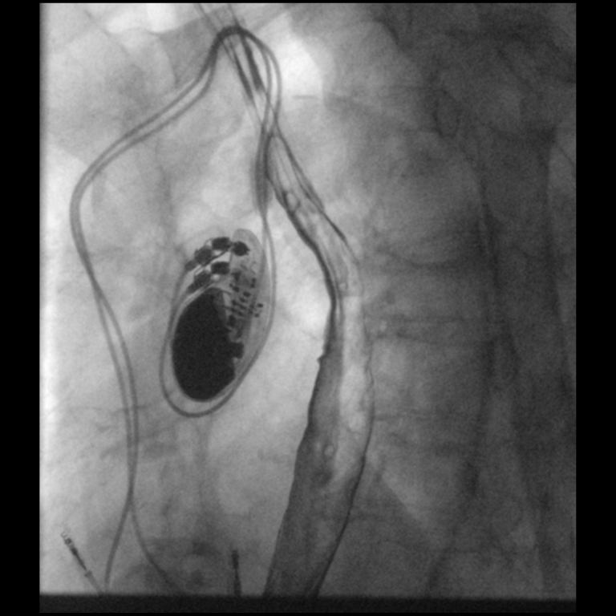

[Series 7: cp_standard · 0.18mm/px · 2 of 153 frames shown (7 of 9)]
[frame 6/153]
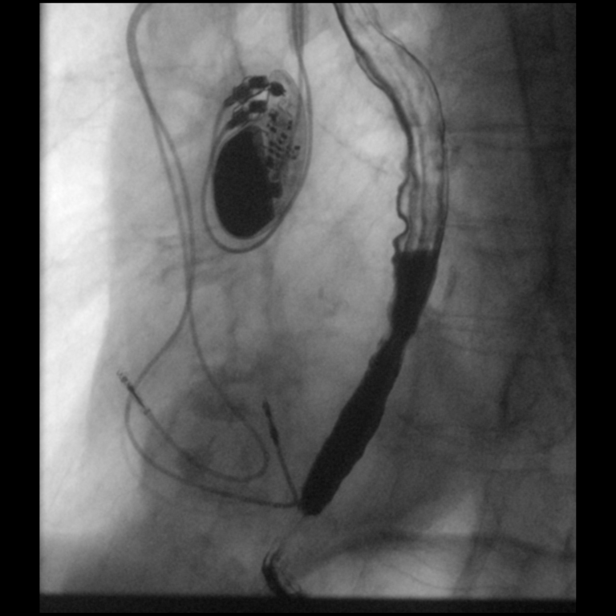
[frame 131/153]
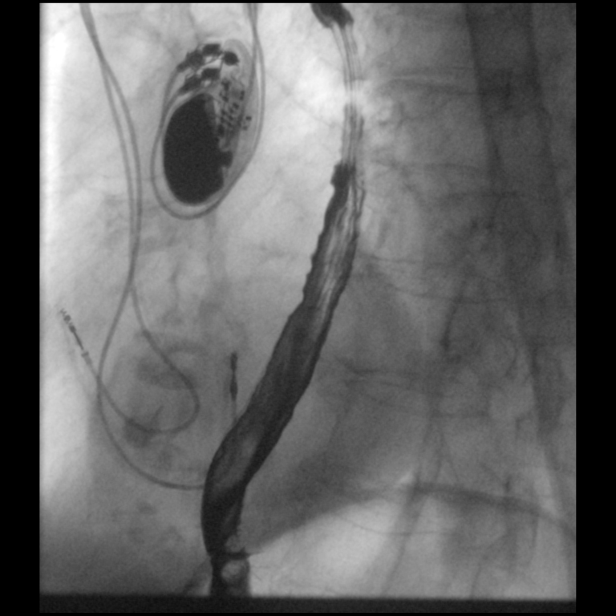

[Series 8: cp_standard · 0.27mm/px · 1 of 199 frames shown (8 of 9)]
[frame 100/199]
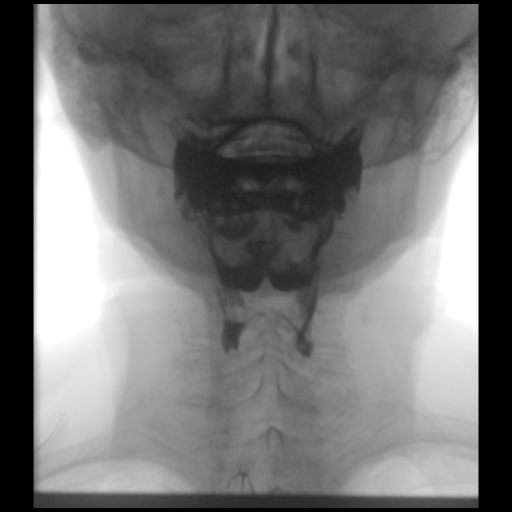

[Series 9: cp_standard · 0.27mm/px · 2 of 293 frames shown (9 of 9)]
[frame 40/293]
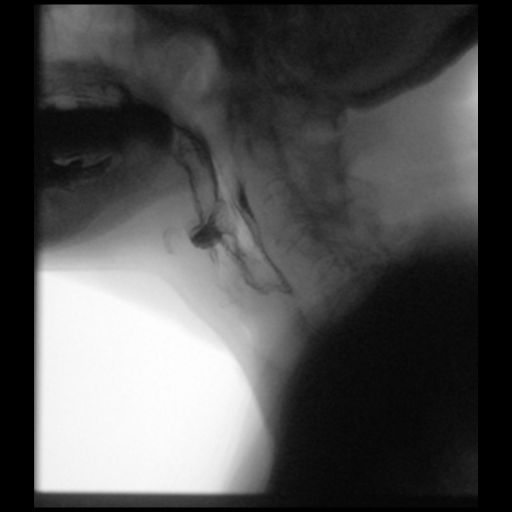
[frame 250/293]
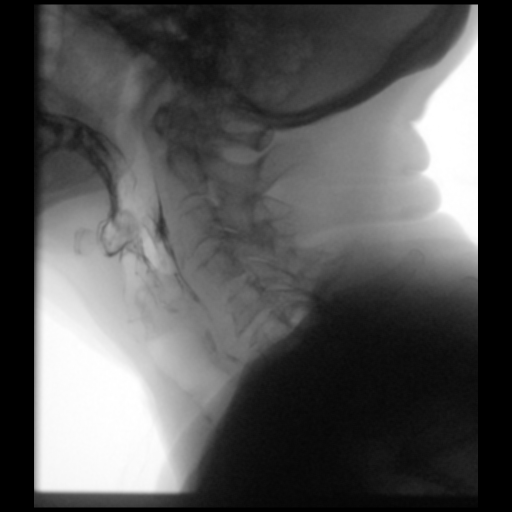

[15 of 24 positions shown; findings below may reference images not displayed]

FINDINGS: Esophageal distention: Grossly normal without mass or stricture

Filling defects:  None

12.5 mm barium tablet: Patient was unable to swallow the 12.5 mm
diameter barium tablet. When attempting to swallow the tablet with
water, patient aspirated a significant amount of water, with a weak
spontaneous cough and breathing; symptoms resolved after a few
voluntary coughs, which were also week period

Motility:  Diffuse moderate age-related esophageal dysmotility

Mucosa:  Smooth without irregularity or ulceration

Hypopharynx/cervical esophagus: Laryngeal penetration to the level
of the vocal cords. No witnessed aspiration of barium. However
patient had an episode of pronounced coughing and difficulty
breathing after attempting to swallow the 12.5 mm barium tablet with
water, clinically aspirated.

Hiatal hernia:  Absent

GE reflux:  Not witnessed during exam

Other:  N/A
IMPRESSION: Laryngeal penetration to the level of the vocal cords without
witnessed aspiration.

However patient clinically aspirated a large volume of water while
attempting to swallow the 12.5 mm diameter barium tablet, followed
by a weak spontaneous cough and difficulty breathing.

Patient was unable to swallow the 12.5 mm diameter barium tablet.

Moderate age-related esophageal dysmotility.

No gross evidence of esophageal mass or stricture.

## 2020-01-10 IMAGING — CR DG CHEST 1V PORT
1 series · 1 of 1 positions shown · non-contrast
Comparison: Radiographs 05/07/2012.

CLINICAL DATA: Weakness.  Encephalopathy.

EXAM:
PORTABLE CHEST 1 VIEW

[portable]
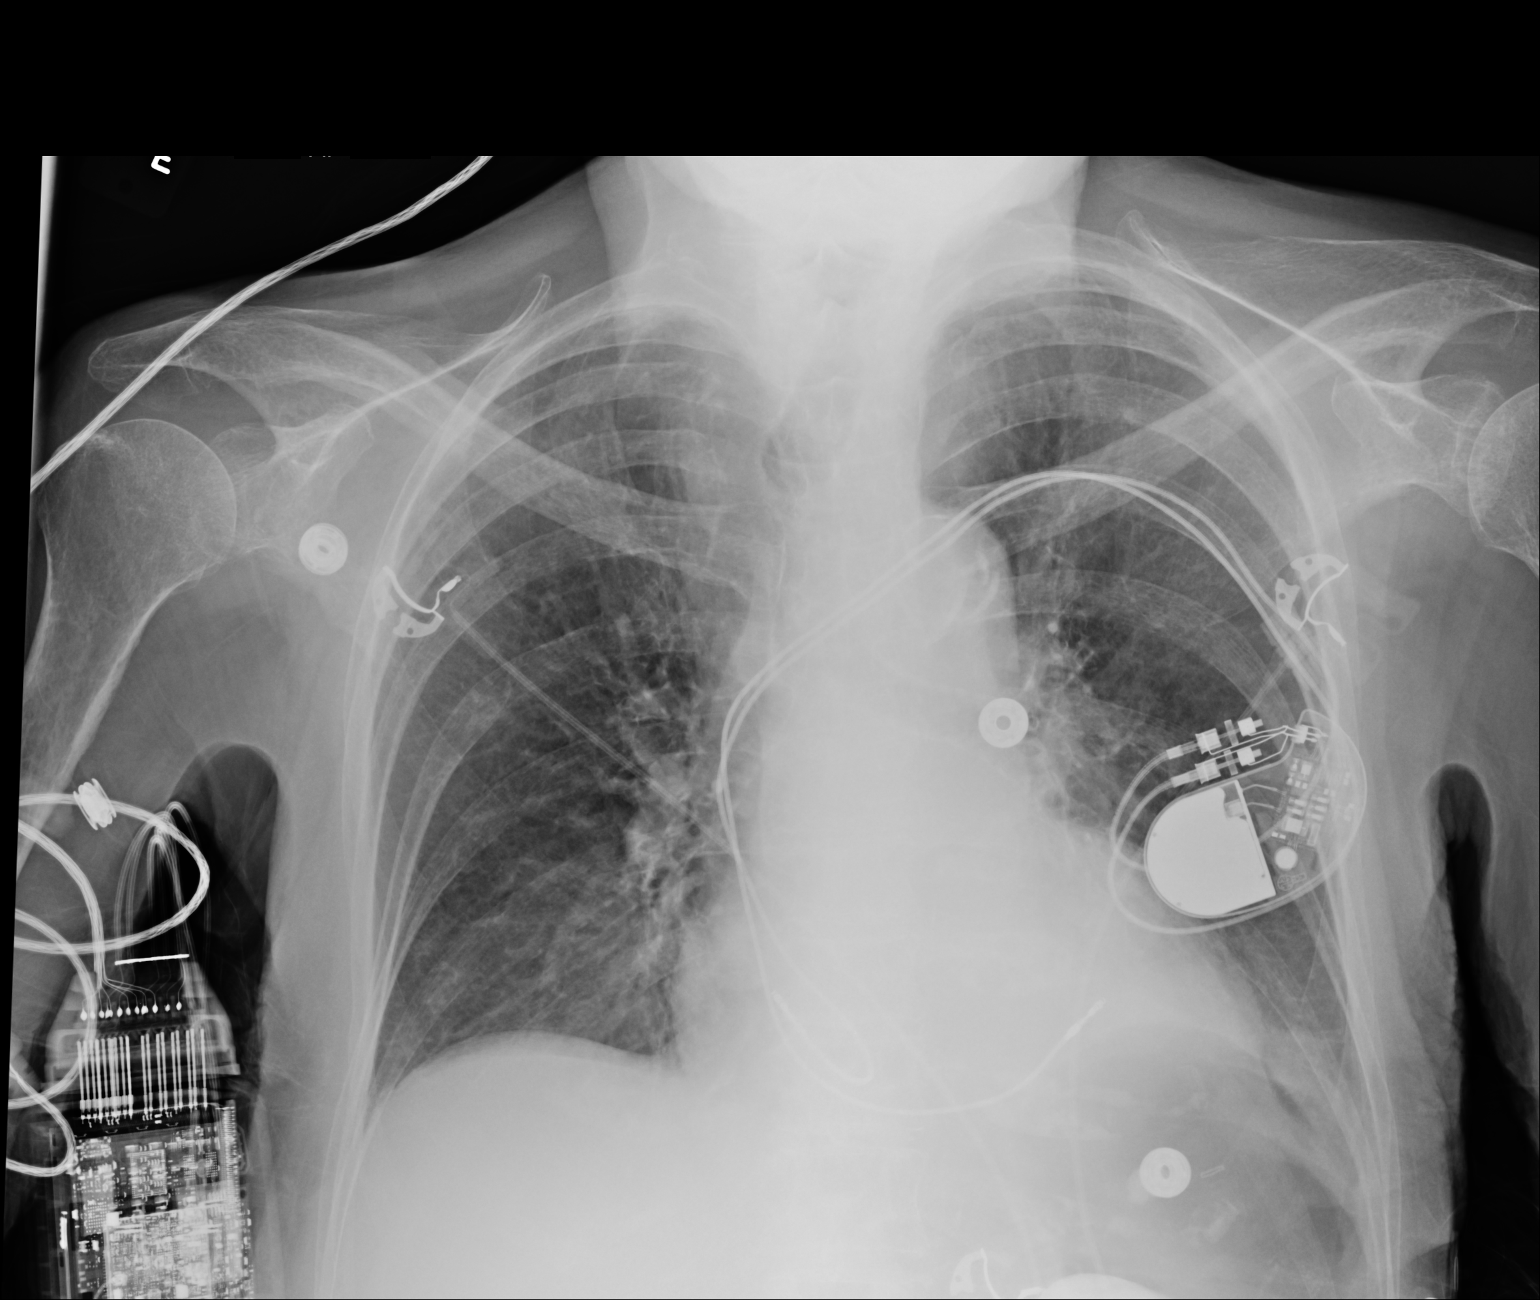

[1 of 1 positions shown; findings below may reference images not displayed]

FINDINGS: 4300 hours. Left subclavian pacemaker leads extend into the right
atrium and right ventricle. The heart size and mediastinal contours
are stable. There is aortic atherosclerosis. There is increased
retrocardiac opacity which may reflect atelectasis or early
infiltrate. No edema, pneumothorax or significant pleural effusion.
The bones appear unchanged.
IMPRESSION: New left lower lobe atelectasis or infiltrate. Radiographic follow
up recommended.
